# Patient Record
Sex: Female | Born: 1935 | Race: White | Hispanic: No | Smoking: Never smoker
Health system: Southern US, Community
[De-identification: ages and names within clinical notes are randomized; demographics above are authoritative.]

## PROBLEM LIST (undated history)

## (undated) DIAGNOSIS — I2510 Atherosclerotic heart disease of native coronary artery without angina pectoris: Secondary | ICD-10-CM

## (undated) DIAGNOSIS — I509 Heart failure, unspecified: Secondary | ICD-10-CM

## (undated) DIAGNOSIS — I10 Essential (primary) hypertension: Secondary | ICD-10-CM

## (undated) HISTORY — PX: OTHER SURGICAL HISTORY: SHX169

## (undated) HISTORY — PX: TOTAL HIP ARTHROPLASTY: SHX124

## (undated) HISTORY — PX: CORONARY ARTERY BYPASS GRAFT: SHX141

## (undated) HISTORY — PX: ABDOMINAL HYSTERECTOMY: SHX81

## (undated) HISTORY — PX: CHOLECYSTECTOMY: SHX55

---

## 2008-02-15 ENCOUNTER — Inpatient Hospital Stay: Payer: Self-pay | Admitting: Internal Medicine

## 2008-02-19 ENCOUNTER — Encounter: Payer: Self-pay | Admitting: Internal Medicine

## 2008-02-25 ENCOUNTER — Encounter: Payer: Self-pay | Admitting: Internal Medicine

## 2008-04-06 ENCOUNTER — Emergency Department: Payer: Self-pay | Admitting: Emergency Medicine

## 2008-04-29 ENCOUNTER — Encounter: Payer: Self-pay | Admitting: Internal Medicine

## 2008-05-25 ENCOUNTER — Encounter: Payer: Self-pay | Admitting: Internal Medicine

## 2008-06-25 ENCOUNTER — Encounter: Payer: Self-pay | Admitting: Internal Medicine

## 2008-07-02 ENCOUNTER — Ambulatory Visit: Payer: Self-pay | Admitting: Family

## 2008-07-08 ENCOUNTER — Ambulatory Visit: Payer: Self-pay | Admitting: Internal Medicine

## 2008-09-01 ENCOUNTER — Ambulatory Visit: Payer: Self-pay | Admitting: Specialist

## 2008-09-15 ENCOUNTER — Ambulatory Visit: Payer: Self-pay | Admitting: Internal Medicine

## 2008-10-20 ENCOUNTER — Ambulatory Visit: Payer: Self-pay | Admitting: Specialist

## 2010-08-12 IMAGING — CR DG SHOULDER 3+V*L*
1 series · 3 of 3 positions shown · non-contrast
Comparison: none

REASON FOR EXAM: pain
COMMENTS:

PROCEDURE:     DXR - DXR SHOULDER LEFT COMPLETE  - July 02, 2008 [DATE]
RESULT:     No fracture, dislocation or other acute bony abnormality is
identified. If rotator cuff injury is a clinical consideration, the shoulder
could be further evaluated by MRI.

[Series 1: view not recorded · 0.17mm/px · 3 of 3 slices shown]
[im 1/3]
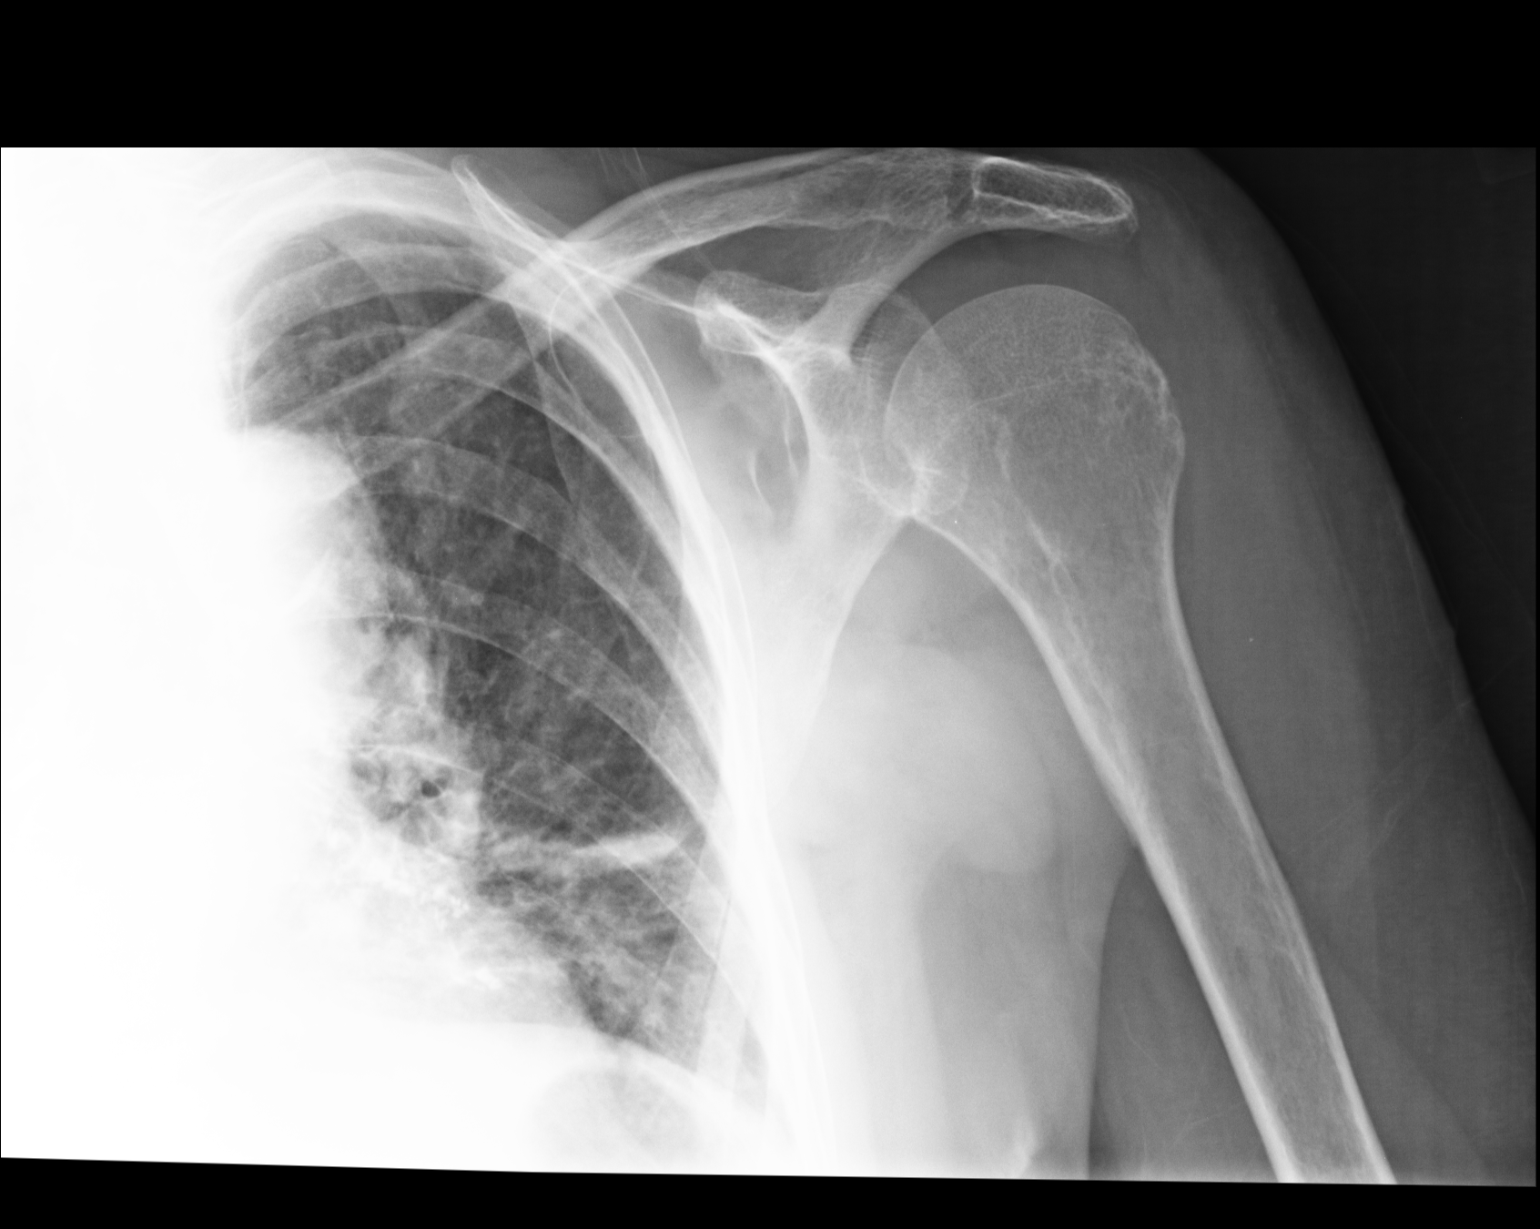
[im 2/3]
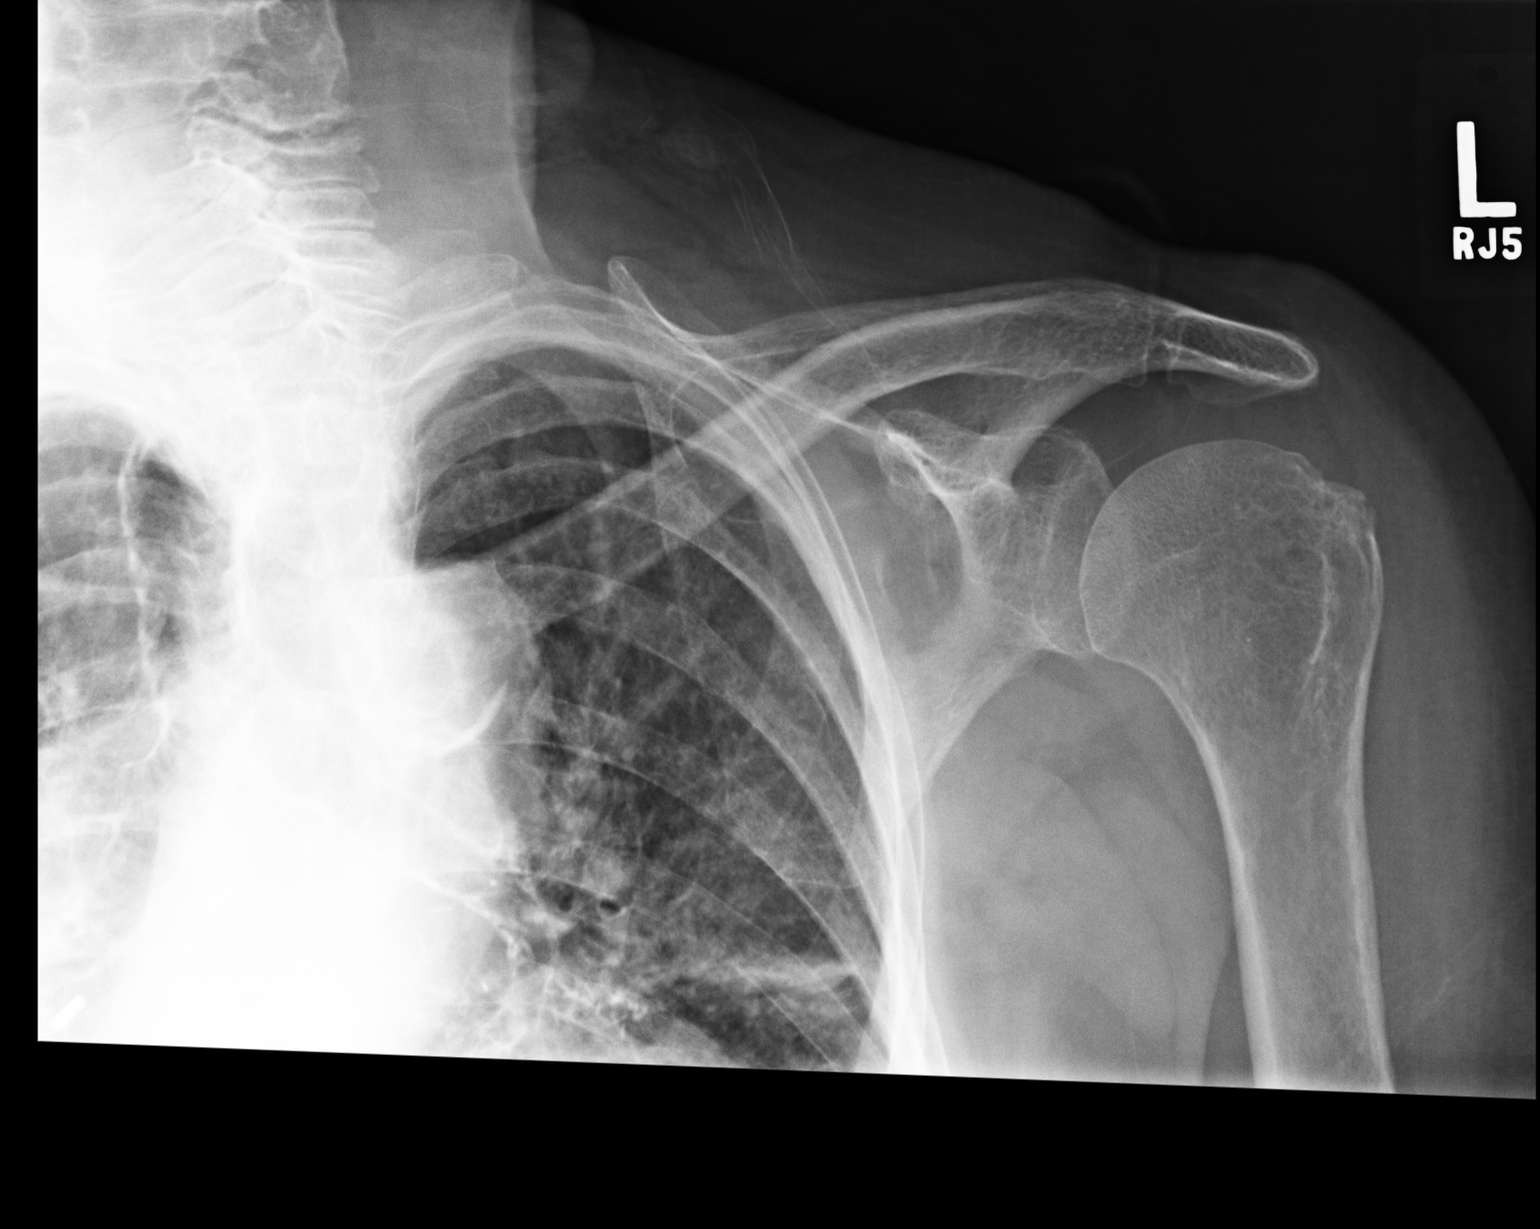
[im 3/3]
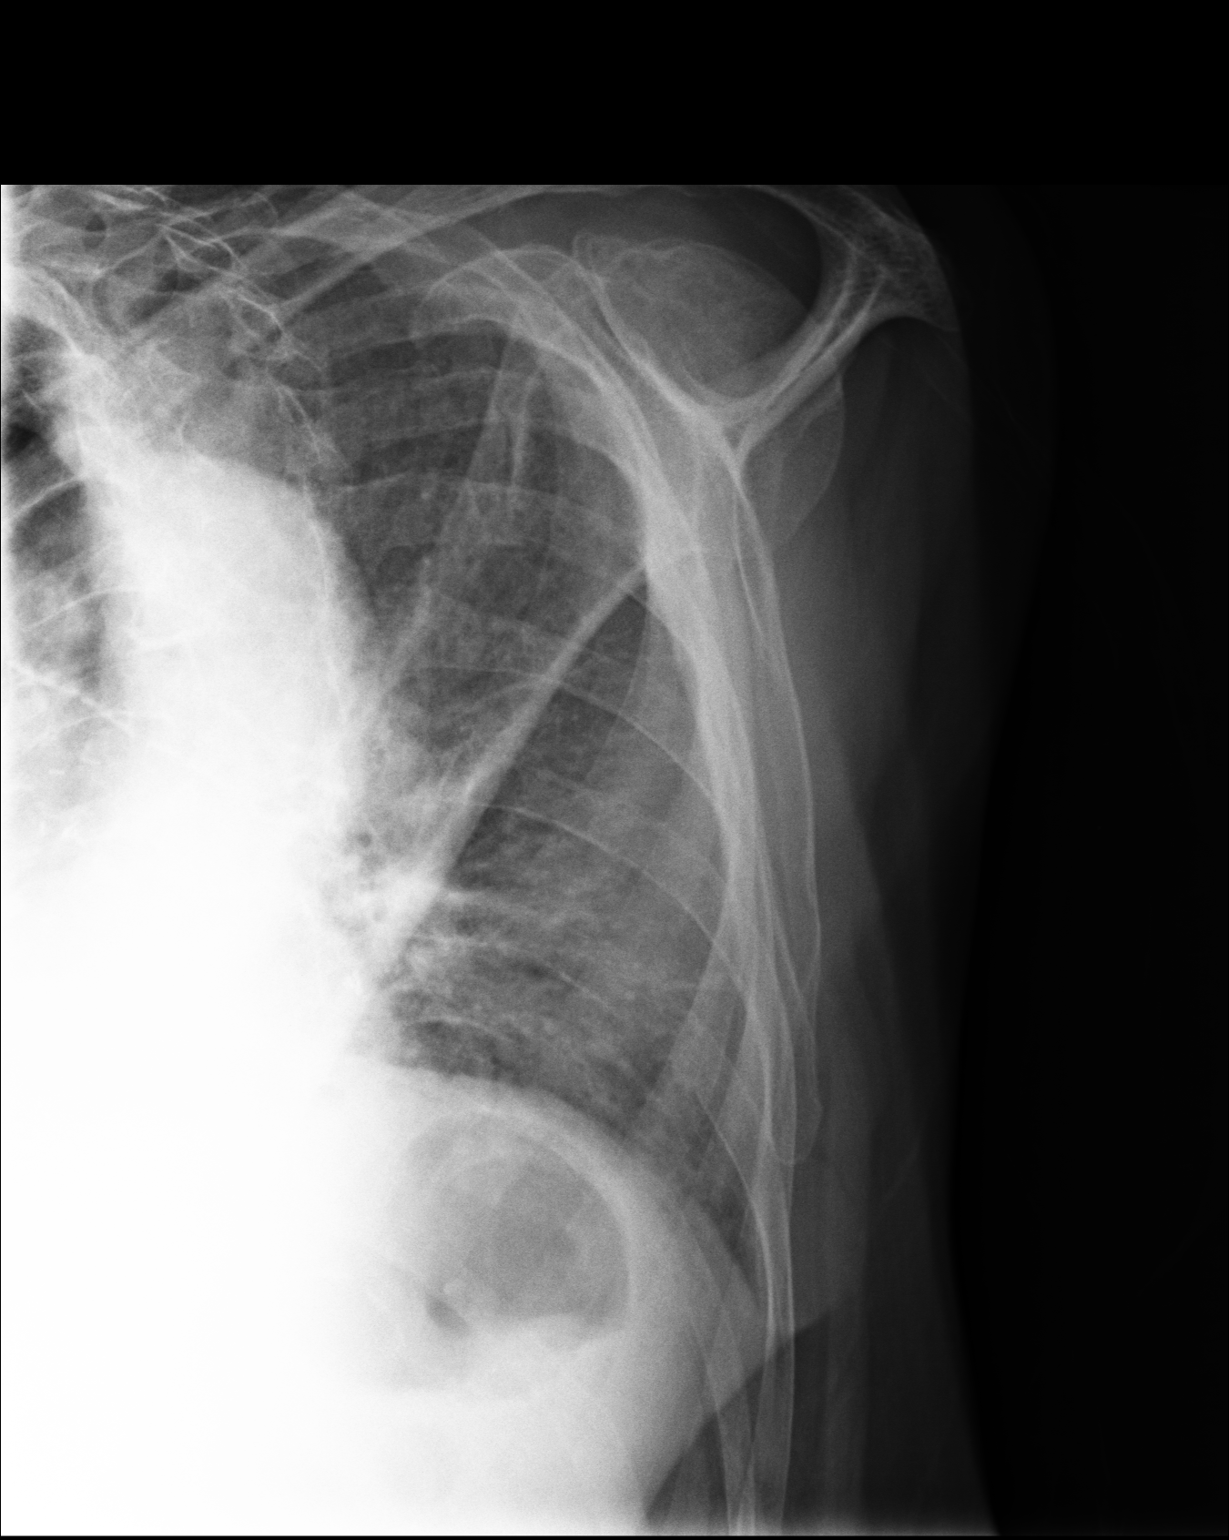

[3 of 3 positions shown; findings below may reference images not displayed]

IMPRESSION: 1.     No acute changes are identified.

## 2010-08-12 IMAGING — CR CERVICAL SPINE - COMPLETE 4+ VIEW
1 series · 7 of 7 positions shown · non-contrast
Comparison: none

REASON FOR EXAM: pain
COMMENTS:

[Series 1: view not recorded · 0.17mm/px · 7 of 7 slices shown]
[im 1/7]
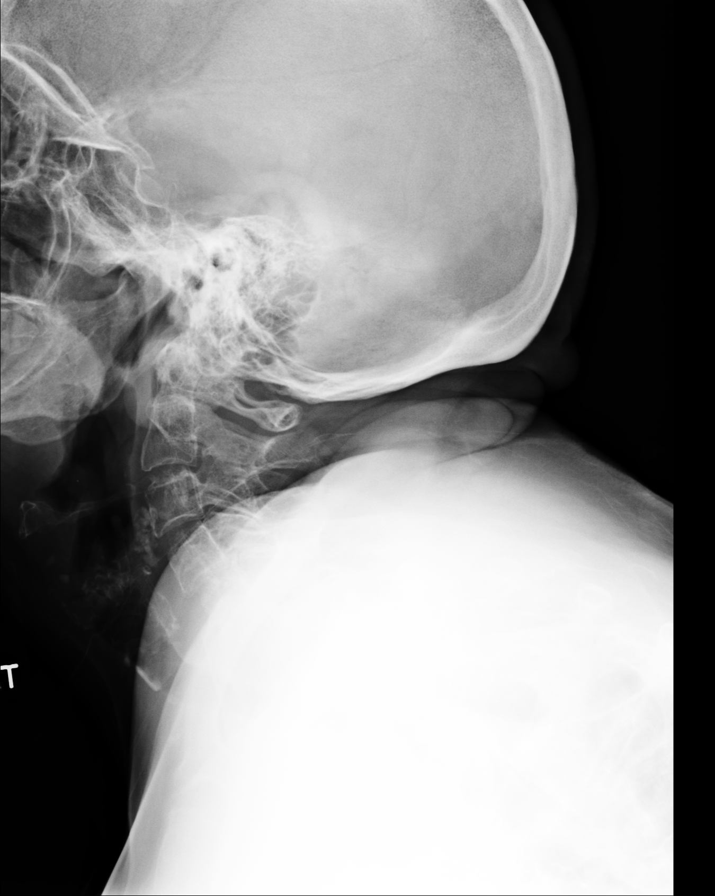
[im 2/7]
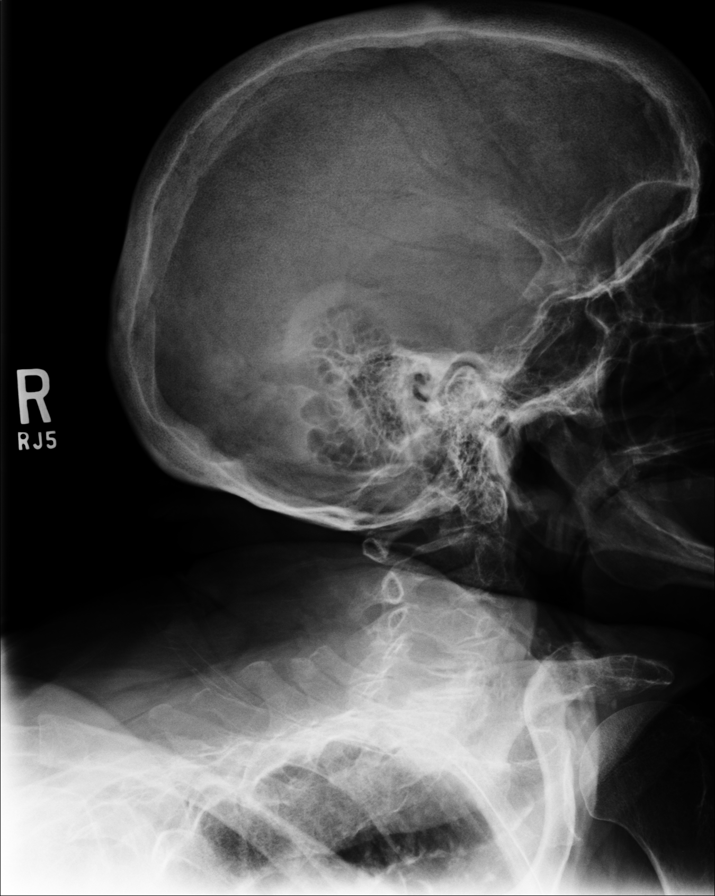
[im 3/7]
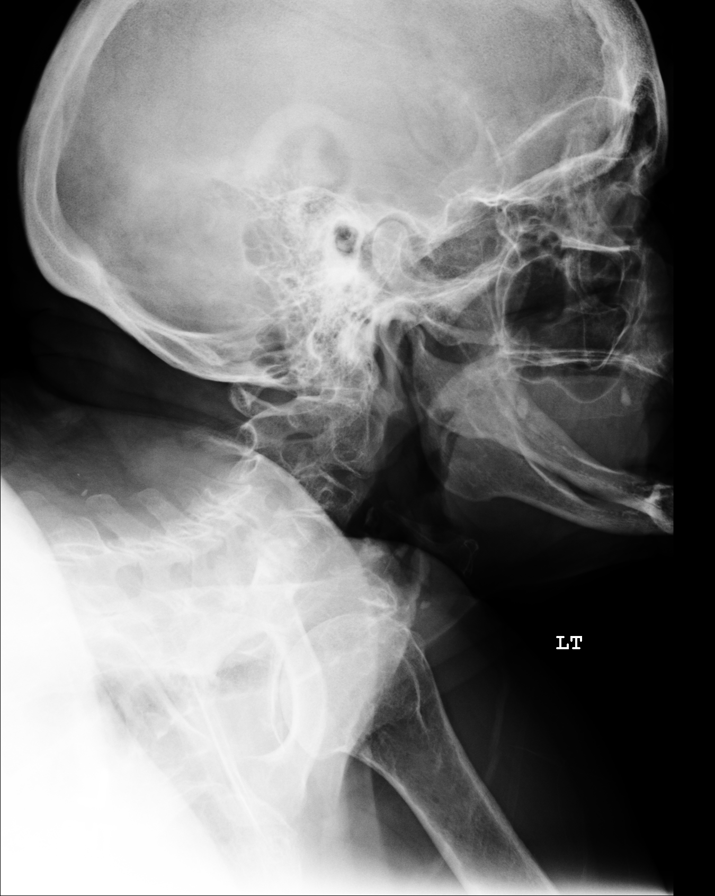
[im 4/7]
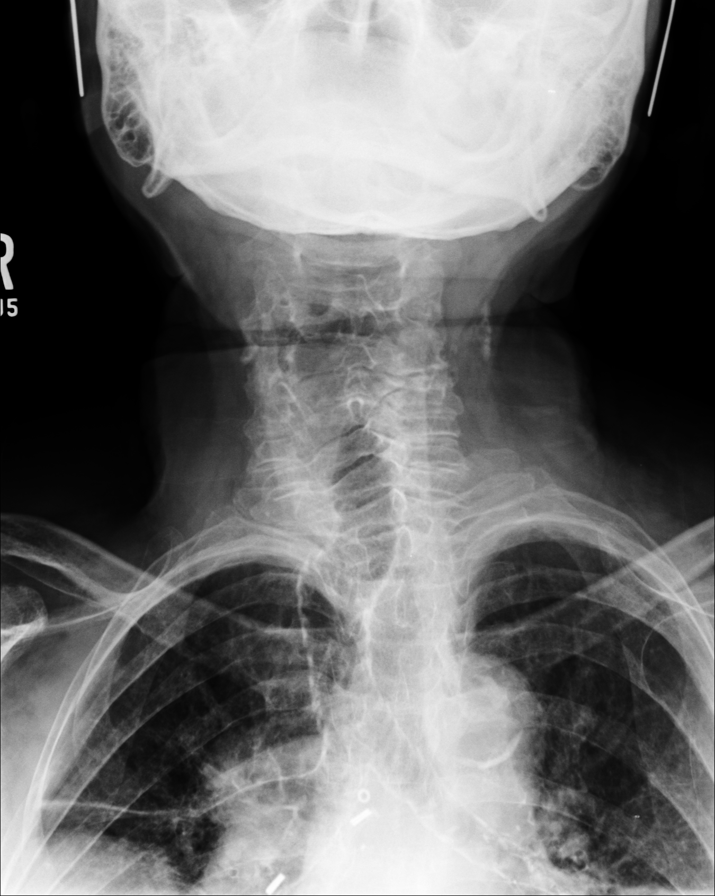
[im 5/7]
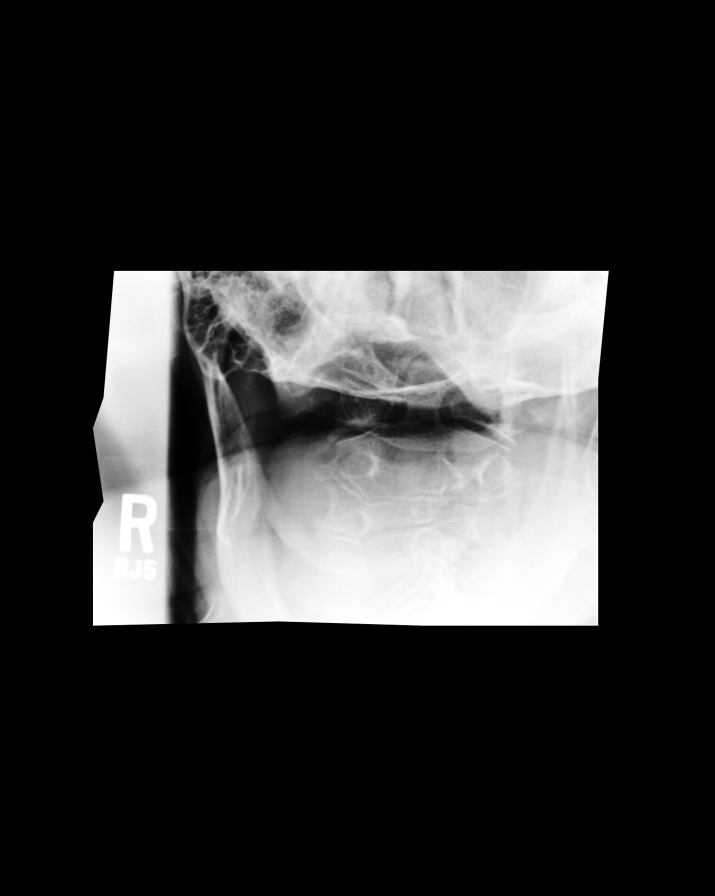
[im 6/7]
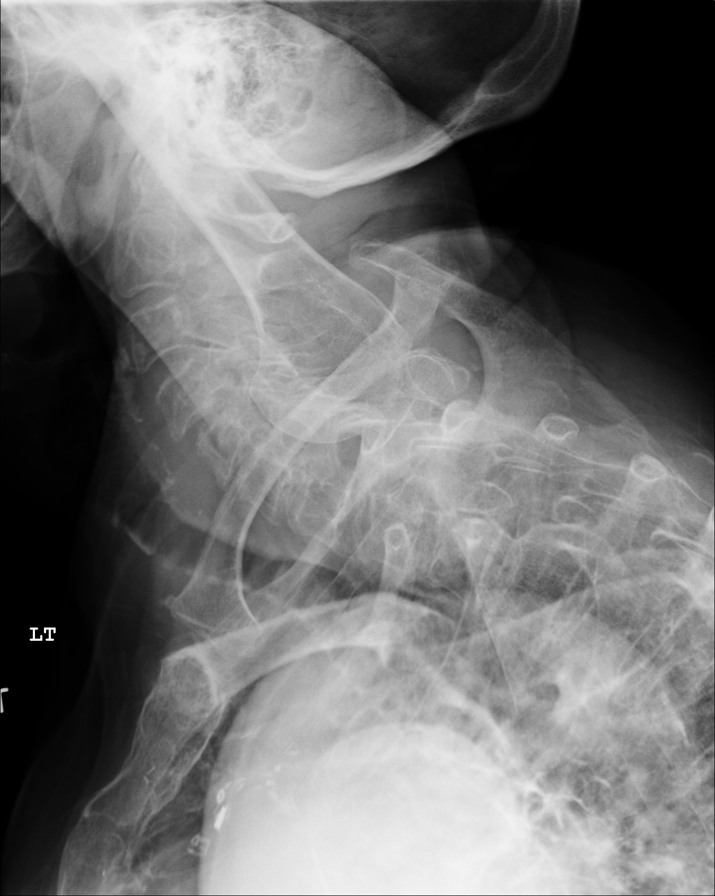
[im 7/7]
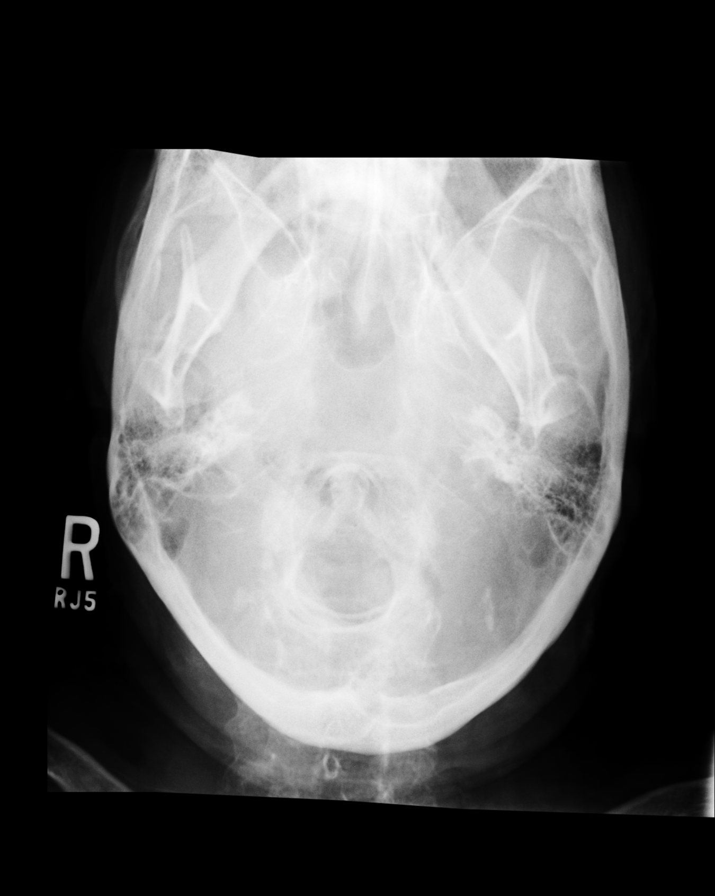

[7 of 7 positions shown; findings below may reference images not displayed]

PROCEDURE:     DXR - DXR CERVICAL SPINE COMPLETE  - July 02, 2008 [DATE]

RESULT:     The vertebral body heights and the intervertebral disc spaces
are well maintained. The vertebral body alignment is normal. There is an
exaggerated lordotic curvature of the cervical spine, possibly compensating
for a lordotic curvature of the thoracic spine. Oblique views shows spur
impingement on the neural foramina at C5-C6 on the right. No definite spur
impingement is seen on the left. The odontoid process is intact. In the AP
view there is noted a slight cervicothoracic scoliosis.
IMPRESSION: 1. No fracture is seen.
2. No definite disc space narrowing is identified.
3. There is an exaggerated lordotic curvature of the cervical spine.
4. There is apparent spur impingement on the neural foramina at C5-C6 and
although not mentioned above there is possible spur impingement on the
neural foramina at C4-C5 on the right also.

## 2010-10-26 IMAGING — CR DG CHEST 2V
1 series · 2 of 2 positions shown · non-contrast
Comparison: none

REASON FOR EXAM: sob
COMMENTS:

PROCEDURE:     DXR - DXR CHEST PA (OR AP) AND LATERAL  - September 15, 2008 [DATE]
RESULT:     Comparison: None

[Series 1: view not recorded · 0.17mm/px · 2 of 2 slices shown]
[im 1/2]
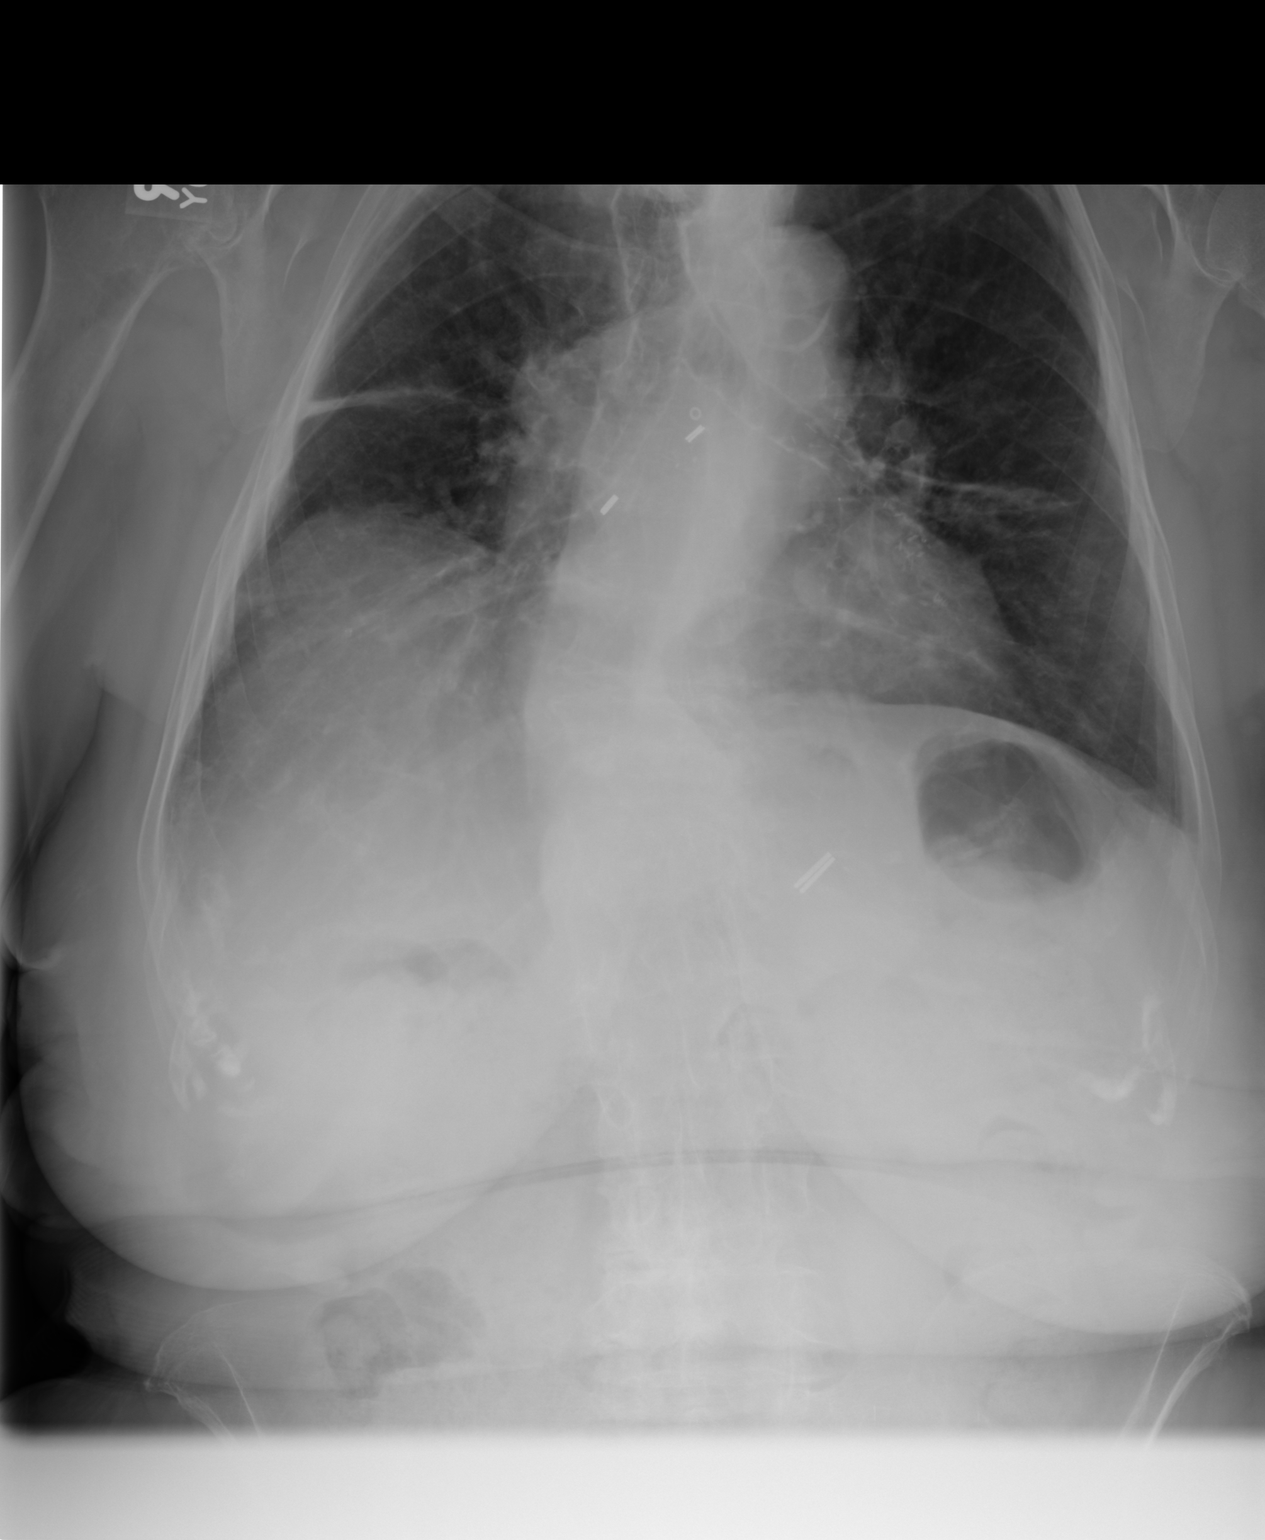
[im 2/2]
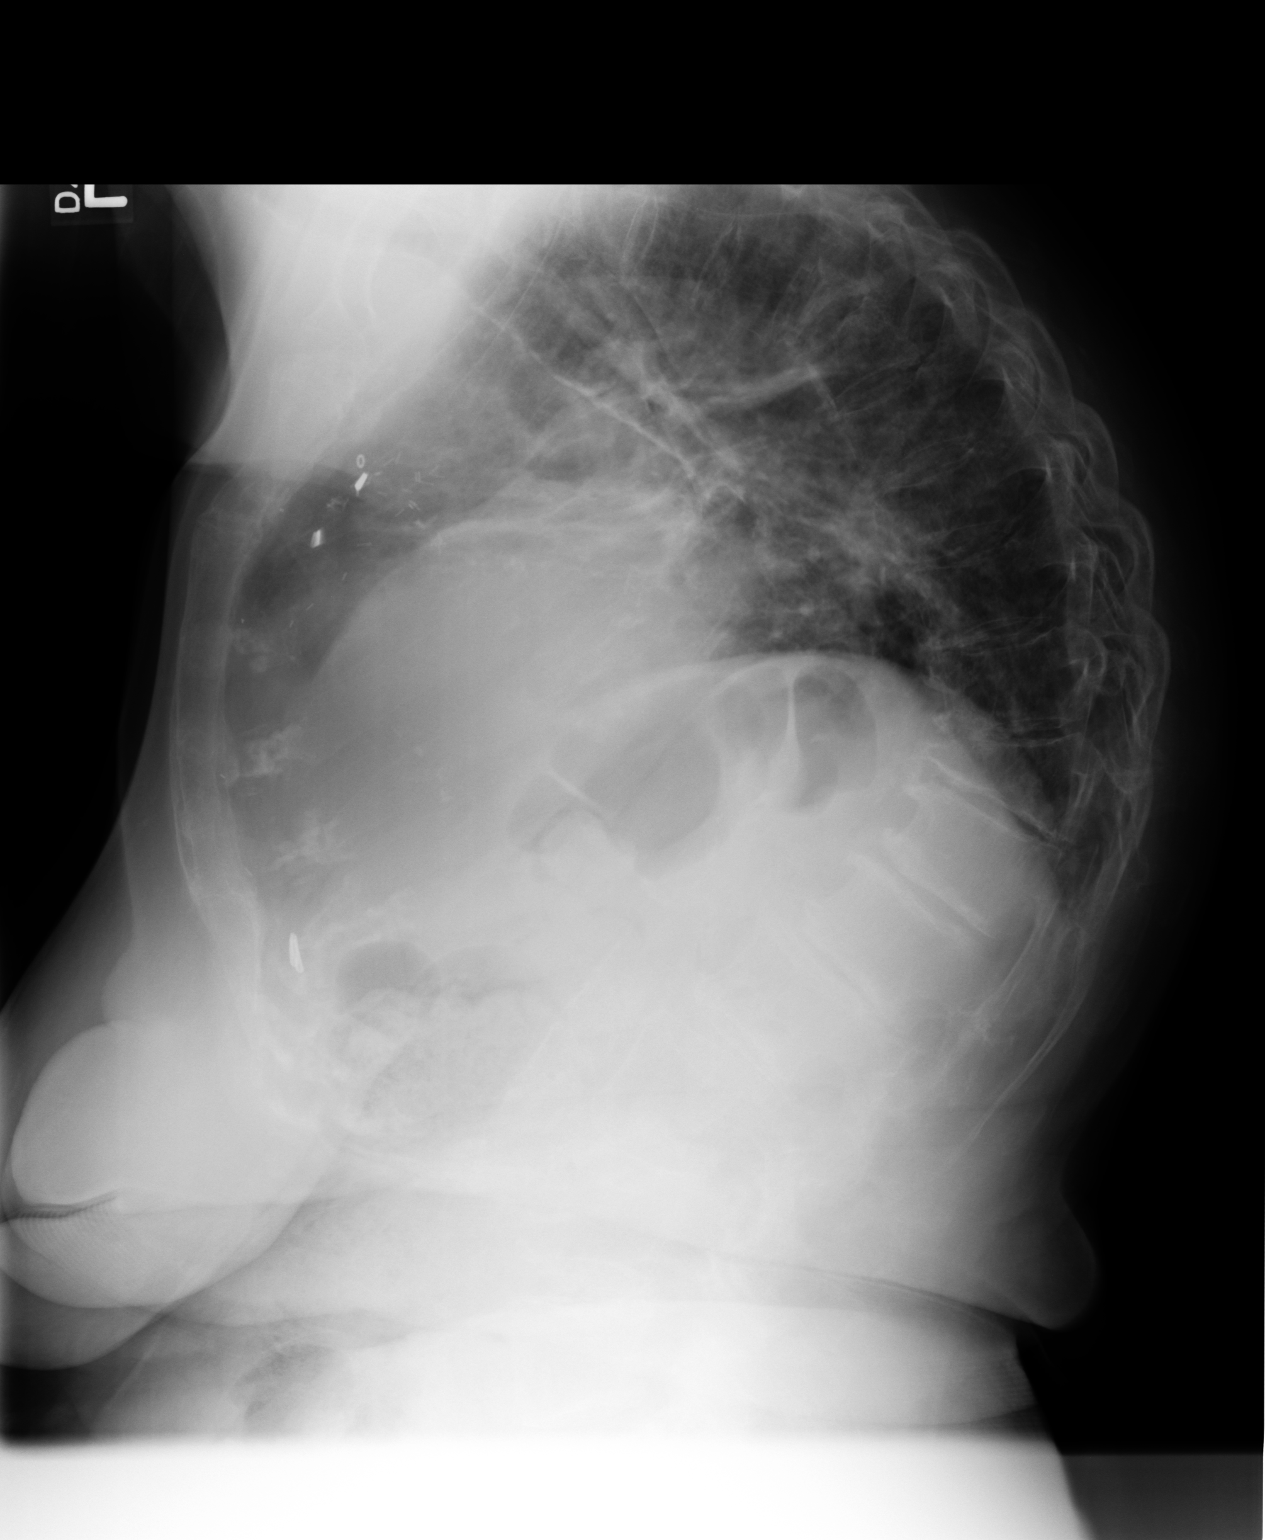

[2 of 2 positions shown; findings below may reference images not displayed]

FINDINGS: PA and lateral chest radiographs are provided. There are low lung volumes.
There is elevation of the right hemidiaphragm. There is thickening of the
minor fissure likely resenting fibrosis. There is no focal parenchymal
opacity, pleural effusion, or pneumothorax. The heart and mediastinum are
unremarkable. There is a vertebral body compression fracture of the lower
thoracic spine with kyphosis centered around this vertebral body compression
fracture.
IMPRESSION: No acute disease of the chest.

Vertebral body compression fracture of the lower thoracic spine which is of
indeterminate age.

## 2015-09-04 ENCOUNTER — Emergency Department: Payer: Medicare Other

## 2015-09-04 ENCOUNTER — Encounter: Payer: Self-pay | Admitting: Emergency Medicine

## 2015-09-04 ENCOUNTER — Observation Stay
Admission: EM | Admit: 2015-09-04 | Discharge: 2015-09-05 | Disposition: A | Discharge status: Home / Self Care | Payer: Medicare Other | Attending: Internal Medicine | Admitting: Internal Medicine

## 2015-09-04 DIAGNOSIS — R9389 Abnormal findings on diagnostic imaging of other specified body structures: Secondary | ICD-10-CM

## 2015-09-04 DIAGNOSIS — Z7982 Long term (current) use of aspirin: Secondary | ICD-10-CM | POA: Diagnosis not present

## 2015-09-04 DIAGNOSIS — R52 Pain, unspecified: Secondary | ICD-10-CM | POA: Diagnosis present

## 2015-09-04 DIAGNOSIS — I10 Essential (primary) hypertension: Secondary | ICD-10-CM

## 2015-09-04 DIAGNOSIS — S2242XA Multiple fractures of ribs, left side, initial encounter for closed fracture: Secondary | ICD-10-CM | POA: Diagnosis not present

## 2015-09-04 DIAGNOSIS — I110 Hypertensive heart disease with heart failure: Secondary | ICD-10-CM | POA: Insufficient documentation

## 2015-09-04 DIAGNOSIS — S2232XA Fracture of one rib, left side, initial encounter for closed fracture: Secondary | ICD-10-CM

## 2015-09-04 DIAGNOSIS — Z885 Allergy status to narcotic agent status: Secondary | ICD-10-CM | POA: Diagnosis not present

## 2015-09-04 DIAGNOSIS — E042 Nontoxic multinodular goiter: Secondary | ICD-10-CM | POA: Insufficient documentation

## 2015-09-04 DIAGNOSIS — Z96649 Presence of unspecified artificial hip joint: Secondary | ICD-10-CM | POA: Insufficient documentation

## 2015-09-04 DIAGNOSIS — Z90710 Acquired absence of both cervix and uterus: Secondary | ICD-10-CM | POA: Diagnosis not present

## 2015-09-04 DIAGNOSIS — S2239XA Fracture of one rib, unspecified side, initial encounter for closed fracture: Secondary | ICD-10-CM | POA: Diagnosis present

## 2015-09-04 DIAGNOSIS — Z9049 Acquired absence of other specified parts of digestive tract: Secondary | ICD-10-CM | POA: Insufficient documentation

## 2015-09-04 DIAGNOSIS — Z79899 Other long term (current) drug therapy: Secondary | ICD-10-CM | POA: Insufficient documentation

## 2015-09-04 DIAGNOSIS — Z951 Presence of aortocoronary bypass graft: Secondary | ICD-10-CM | POA: Diagnosis not present

## 2015-09-04 DIAGNOSIS — W19XXXA Unspecified fall, initial encounter: Secondary | ICD-10-CM | POA: Diagnosis not present

## 2015-09-04 DIAGNOSIS — I2510 Atherosclerotic heart disease of native coronary artery without angina pectoris: Secondary | ICD-10-CM

## 2015-09-04 HISTORY — DX: Atherosclerotic heart disease of native coronary artery without angina pectoris: I25.10

## 2015-09-04 HISTORY — DX: Heart failure, unspecified: I50.9

## 2015-09-04 HISTORY — DX: Essential (primary) hypertension: I10

## 2015-09-04 LAB — URINALYSIS COMPLETE WITH MICROSCOPIC (ARMC ONLY)
BACTERIA UA: NONE SEEN
Bilirubin Urine: NEGATIVE
Glucose, UA: NEGATIVE mg/dL
HGB URINE DIPSTICK: NEGATIVE
Ketones, ur: NEGATIVE mg/dL
LEUKOCYTES UA: NEGATIVE
Nitrite: NEGATIVE
PH: 5 (ref 5.0–8.0)
PROTEIN: NEGATIVE mg/dL
SPECIFIC GRAVITY, URINE: 1.009 (ref 1.005–1.030)

## 2015-09-04 LAB — PROTIME-INR
INR: 1.08
Prothrombin Time: 14.2 seconds (ref 11.4–15.0)

## 2015-09-04 LAB — BASIC METABOLIC PANEL
ANION GAP: 9 (ref 5–15)
BUN: 27 mg/dL — ABNORMAL HIGH (ref 6–20)
CALCIUM: 9.4 mg/dL (ref 8.9–10.3)
CO2: 31 mmol/L (ref 22–32)
Chloride: 101 mmol/L (ref 101–111)
Creatinine, Ser: 1.2 mg/dL — ABNORMAL HIGH (ref 0.44–1.00)
GFR, EST AFRICAN AMERICAN: 45 mL/min — AB (ref >60)
GFR, EST NON AFRICAN AMERICAN: 39 mL/min — AB (ref >60)
Glucose, Bld: 105 mg/dL — ABNORMAL HIGH (ref 65–99)
Potassium: 4.4 mmol/L (ref 3.5–5.1)
Sodium: 141 mmol/L (ref 135–145)

## 2015-09-04 LAB — APTT: APTT: 31 s (ref 24–36)

## 2015-09-04 LAB — CBC
HEMATOCRIT: 34 % — AB (ref 35.0–47.0)
Hemoglobin: 11.4 g/dL — ABNORMAL LOW (ref 12.0–16.0)
MCH: 31.3 pg (ref 26.0–34.0)
MCHC: 33.4 g/dL (ref 32.0–36.0)
MCV: 93.6 fL (ref 80.0–100.0)
Platelets: 197 10*3/uL (ref 150–440)
RBC: 3.63 MIL/uL — AB (ref 3.80–5.20)
RDW: 13.7 % (ref 11.5–14.5)
WBC: 11.7 10*3/uL — AB (ref 3.6–11.0)

## 2015-09-04 MED ORDER — MORPHINE SULFATE (PF) 2 MG/ML IV SOLN: 0.5000 mg | INTRAVENOUS | Status: DC | PRN

## 2015-09-04 MED ORDER — FUROSEMIDE 40 MG PO TABS
40.0000 mg | ORAL_TABLET | ORAL | Status: DC
  Administered 2015-09-05: 40 mg via ORAL
  Filled 2015-09-04: qty 1

## 2015-09-04 MED ORDER — ONDANSETRON HCL 4 MG/2ML IJ SOLN: 4.0000 mg | Freq: Four times a day (QID) | INTRAMUSCULAR | Status: DC | PRN

## 2015-09-04 MED ORDER — LORATADINE 10 MG PO TABS
10.0000 mg | ORAL_TABLET | ORAL | Status: DC
  Administered 2015-09-05: 10 mg via ORAL
  Filled 2015-09-04: qty 1

## 2015-09-04 MED ORDER — ASPIRIN EC 81 MG PO TBEC
81.0000 mg | DELAYED_RELEASE_TABLET | Freq: Every day | ORAL | Status: DC
  Administered 2015-09-04: 81 mg via ORAL
  Filled 2015-09-04: qty 1

## 2015-09-04 MED ORDER — LIDOCAINE 5 % EX PTCH
1.0000 | MEDICATED_PATCH | CUTANEOUS | Status: DC
  Administered 2015-09-04: 1 via TRANSDERMAL
  Filled 2015-09-04 (×2): qty 1

## 2015-09-04 MED ORDER — BENZONATATE 100 MG PO CAPS: 100.0000 mg | ORAL_CAPSULE | Freq: Three times a day (TID) | ORAL | Status: DC | PRN

## 2015-09-04 MED ORDER — BISACODYL 5 MG PO TBEC: 5.0000 mg | DELAYED_RELEASE_TABLET | Freq: Every day | ORAL | Status: DC | PRN

## 2015-09-04 MED ORDER — METOPROLOL SUCCINATE ER 25 MG PO TB24
25.0000 mg | ORAL_TABLET | Freq: Every day | ORAL | Status: DC
  Administered 2015-09-05: 25 mg via ORAL
  Filled 2015-09-04: qty 1

## 2015-09-04 MED ORDER — ACETAMINOPHEN 325 MG PO TABS
650.0000 mg | ORAL_TABLET | Freq: Four times a day (QID) | ORAL | Status: DC | PRN
  Administered 2015-09-05: 650 mg via ORAL
  Filled 2015-09-04: qty 2

## 2015-09-04 MED ORDER — ACETAMINOPHEN 500 MG PO TABS
1000.0000 mg | ORAL_TABLET | ORAL | Status: AC
  Administered 2015-09-04: 1000 mg via ORAL
  Filled 2015-09-04: qty 2

## 2015-09-04 MED ORDER — PANTOPRAZOLE SODIUM 40 MG PO TBEC
40.0000 mg | DELAYED_RELEASE_TABLET | Freq: Every day | ORAL | Status: DC
  Administered 2015-09-05: 40 mg via ORAL
  Filled 2015-09-04: qty 1

## 2015-09-04 MED ORDER — ONDANSETRON HCL 4 MG PO TABS: 4.0000 mg | ORAL_TABLET | Freq: Four times a day (QID) | ORAL | Status: DC | PRN

## 2015-09-04 MED ORDER — CITALOPRAM HYDROBROMIDE 20 MG PO TABS
20.0000 mg | ORAL_TABLET | Freq: Every day | ORAL | Status: DC
  Administered 2015-09-04: 20 mg via ORAL
  Filled 2015-09-04: qty 1

## 2015-09-04 MED ORDER — TRAMADOL HCL 50 MG PO TABS
25.0000 mg | ORAL_TABLET | Freq: Once | ORAL | Status: AC
  Administered 2015-09-04: 25 mg via ORAL

## 2015-09-04 MED ORDER — CELECOXIB 200 MG PO CAPS: 200.0000 mg | ORAL_CAPSULE | Freq: Every evening | ORAL | Status: DC | PRN

## 2015-09-04 MED ORDER — ACETAMINOPHEN 650 MG RE SUPP: 650.0000 mg | Freq: Four times a day (QID) | RECTAL | Status: DC | PRN

## 2015-09-04 MED ORDER — TRAMADOL HCL 50 MG PO TABS
50.0000 mg | ORAL_TABLET | ORAL | Status: DC
  Filled 2015-09-04: qty 1

## 2015-09-04 NOTE — H&P (Addendum)
PCP:   No primary care provider on file.   Chief Complaint:  Left rib pain  HPI: This is a 82 year old female who lives at home with her son and daughter. Yesterday at approximately 1 AM she had a mechanical fall. She uses a walker with wheels. She was able to get up therefore 911 was called and she was transported to the ER. She was seen aND dischargeD home. Today her pain on the left side is gotten worse, she was brought back to the ER.                                   Review of Systems:  The patient denies anorexia, fever, weight loss,, vision loss, decreased hearing, hoarseness, chest pain, syncope, dyspnea on exertion, peripheral edema, balance deficits, hemoptysis, abdominal pain, melena, hematochezia, severe indigestion/heartburn, hematuria, incontinence, genital sores, muscle weakness, suspicious skin lesions, transient blindness, difficulty walking, depression, unusual weight change, abnormal bleeding, enlarged lymph nodes, angioedema, and breast masses.  Past Medical History: Past Medical History  Diagnosis Date  . CHF (congestive heart failure) (HCC)   . Coronary artery disease   . Hypertension    Past Surgical History  Procedure Laterality Date  . Coronary artery bypass graft    . Cholecystectomy    . Abdominal hysterectomy    . Total knee replace N/A   . Total hip arthroplasty N/A     Medications: Prior to Admission medications   Medication Sig Start Date End Date Taking? Authorizing Provider  aspirin EC 81 MG tablet Take 81 mg by mouth at bedtime.   Yes Historical Provider, MD  benzonatate (TESSALON) 100 MG capsule Take 100 mg by mouth 3 (three) times daily as needed for cough.   Yes Historical Provider, MD  celecoxib (CELEBREX) 200 MG capsule Take 200 mg by mouth at bedtime as needed for mild pain or moderate pain.   Yes Historical Provider, MD  citalopram (CELEXA) 20 MG tablet Take 20 mg by mouth at bedtime.   Yes Historical Provider, MD  furosemide (LASIX) 40 MG  tablet Take 40 mg by mouth every morning.   Yes Historical Provider, MD  Homeopathic Products ([employer] LEG CRAMPS PAIN RELIEF) TABS Take 1 tablet by mouth 2 (two) times daily.   Yes Historical Provider, MD  loratadine (CLARITIN) 10 MG tablet Take 10 mg by mouth every morning.   Yes Historical Provider, MD  metoprolol succinate (TOPROL-XL) 25 MG 24 hr tablet Take 25 mg by mouth daily.   Yes Historical Provider, MD  Multiple Vitamins-Minerals (COMPLETE MULTIVITAMIN/MINERAL PO) Take 1 tablet by mouth every morning.   Yes Historical Provider, MD  Multiple Vitamins-Minerals (HAIR SKIN AND NAILS FORMULA) TABS Take 1 tablet by mouth 2 (two) times daily.   Yes Historical Provider, MD  Multiple Vitamins-Minerals (OCUVITE ADULT 50+) CAPS Take 1 capsule by mouth every morning.   Yes Historical Provider, MD  omeprazole (PRILOSEC) 40 MG capsule Take 40 mg by mouth every morning.   Yes Historical Provider, MD  simvastatin (ZOCOR) 20 MG tablet Take 20 mg by mouth every evening.   Yes Historical Provider, MD  traMADol (ULTRAM) 50 MG tablet Take 25 mg by mouth every 4 (four) hours.   Yes Historical Provider, MD  Ubiquinone (ULTRA COQ10 PO) Take 100 mg by mouth every morning.   Yes Historical Provider, MD  zolpidem (AMBIEN) 5 MG tablet Take 0.5 mg by mouth at bedtime.  Yes Historical Provider, MD    Allergies:   Allergies  Allergen Reactions  . Percocet [Oxycodone-Acetaminophen] Other (See Comments)    hallucinations    Social History:  reports that she has never smoked. She does not have any smokeless tobacco history on file. She reports that she does not drink alcohol. Her drug history is not on file.  Family History: History reviewed. No pertinent family history.  Physical Exam: Filed Vitals:   09/04/15 1700 09/04/15 1730 09/04/15 1830 09/04/15 1930  BP: 147/81 163/80  155/83  Pulse: 72 73 59 81  Temp:      TempSrc:      Resp:  22 20 20   Height:      Weight:      SpO2: 99% 100% 100% 93%     General:  Alert and oriented times three, well developed and nourished, no acute distress Eyes: PERRLA, pink conjunctiva, no scleral icterus ENT: Moist oral mucosa, neck supple, no thyromegaly Lungs: clear to ascultation, no wheeze, no crackles, no use of accessory muscles Cardiovascular: regular rate and rhythm, no regurgitation, no gallops, murmurs. No carotid bruits, no JVD Abdomen: soft, positive BS, non-tender, non-distended, no organomegaly, not an acute abdomen GU: not examined Neuro: Appears grossly intact Musculoskeletal: strength not assessed due to patient's rib pain, no clubbing, cyanosis 1+ B/L LE pitting edema Skin: no rash, no subcutaneous crepitation, no decubitus Psych: appropriate patient   Labs on Admission:   Recent Labs  09/04/15 2012  NA 141  K 4.4  CL 101  CO2 31  GLUCOSE 105*  BUN 27*  CREATININE 1.20*  CALCIUM 9.4   No results for input(s): AST, ALT, ALKPHOS, BILITOT, PROT, ALBUMIN in the last 72 hours. No results for input(s): LIPASE, AMYLASE in the last 72 hours.  Recent Labs  09/04/15 2012  WBC 11.7*  HGB 11.4*  HCT 34.0*  MCV 93.6  PLT 197   No results for input(s): CKTOTAL, CKMB, CKMBINDEX, TROPONINI in the last 72 hours. Invalid input(s): POCBNP No results for input(s): DDIMER in the last 72 hours. No results for input(s): HGBA1C in the last 72 hours. No results for input(s): CHOL, HDL, LDLCALC, TRIG, CHOLHDL, LDLDIRECT in the last 72 hours. No results for input(s): TSH, T4TOTAL, T3FREE, THYROIDAB in the last 72 hours.  Invalid input(s): FREET3 No results for input(s): VITAMINB12, FOLATE, FERRITIN, TIBC, IRON, RETICCTPCT in the last 72 hours.  Micro Results: No results found for this or any previous visit (from the past 240 hour(s)).   Radiological Exams on Admission: Ct Chest Wo Contrast  09/04/2015  CLINICAL DATA:  Left rib pain after falling at 2 a.m. today. EXAM: CT CHEST WITHOUT CONTRAST TECHNIQUE: Multidetector CT  imaging of the chest was performed following the standard protocol without IV contrast. COMPARISON:  Chest radiographs dated 09/15/2008. FINDINGS: Mediastinum/Lymph Nodes: Dense atheromatous Coronary artery calcifications. No mediastinal blood or enlarged lymph nodes. Mildly enlarged thyroid gland containing multiple small nodules with coarse calcifications. Lungs/Pleura: A large right diaphragmatic eventration is again demonstrated. Bilateral linear atelectasis a or scarring. No pneumothorax. Small left pleural effusion. Mild left lower lobe atelectasis. Upper abdomen: No acute findings. Musculoskeletal: Mildly displaced left eighth, ninth, tenth and eleventh posterior rib fractures. Additional old, healed bilateral rib fractures. Interval approximately 9% T9 vertebral body compression fracture. This has an appearance suggesting an acute fracture in the axial plane. However, in the coronal and sagittal planes, this appears more chronic. There is associated mild to moderate bony retropulsion, fused with the remainder  the vertebral body. This causing mild to moderate canal stenosis. There are additional T4, T5 and T11 vertebral compression deformities with no visible acute fracture lines. Mild bony retropulsion at the T4-T5 levels. IMPRESSION: 1. Acute left eighth, ninth, tenth and eleventh posterior rib fractures with mild displacement. 2. Small left pleural effusion. 3. Mild left lower lobe atelectasis. 4. Additional bilateral linear atelectasis and scarring. 5. Stable large right diaphragmatic eventration. 6. Multiple old thoracic vertebral compression deformities with progression. No definite acute vertebral fractures are seen. 7. Dense atheromatous coronary artery calcifications. 8. Multinodular thyroid. Electronically Signed   By: Beckie Salts M.D.   On: 09/04/2015 18:52    Assessment/Plan Present on Admission:  . Inadequate pain control . mechanical Fall . Closed rib fracture -Bring in for 23 hour  observation -Continue Tylenol and Ultram. low-dose morphine 0.5 mg when necessary  Heart artery disease -Stable, home medications resumed.  Hypertension -Stable, home medications resumed  Multinodular goiter -TSH, thyroid ultrasound  Jamicheal Heard 09/04/2015, 9:18 PM

## 2015-09-04 NOTE — Care Management Obs Status (Signed)
MEDICARE OBSERVATION STATUS NOTIFICATION   Patient Details  Name: Danielle Mathews MRN: [id number] Date of Birth: 08-Jan-1923   Medicare Observation Status Notification Given:  Yes    CrutchfieldDerrill Memo, Barry Faircloth M, RN 09/04/2015, 9:22 PM

## 2015-09-04 NOTE — ED Provider Notes (Signed)
[facility] Emergency Department Provider Note  ____________________________________________  Time seen: Approximately 5:22 PM  I have reviewed the triage vital signs and the nursing notes.   HISTORY  Chief Complaint Fall    HPI Danielle Mathews is a 82 y.o. female who presents for evaluation after falling last night. She is from [place], and staying with family for the last few days. She got up and uses a walker, but tripped and fell onto her left side. She did not strike her head injure her neck legs arms hips or stomach, but complains of pain right at the back of the left lower rib cage, pointing approximately at the mid axillary line along the ninth and 10th left ribs.  She denies shortness of breath, fevers chills or recent illness. She does fall occasionally. Her family including her daughter are with her and report that she is in her normal state of health otherwise. She does take tramadol, but they're concerned she could've broken a rib. She has otherwise been doing okay, able to walk today with a walker but reports fairly severe pain whenever she goes to twist or move along the left side of the rib cage.   No past medical history on file. Congestive heart failure, with fluid on both lungs chronically as well as lower leg swelling chronically, unchanged per family and patient. Elevated blood pressure Coronary bypass grafting    There are no active problems to display for this patient.   No past surgical history on file.  No current outpatient prescriptions on file.  Allergies Review of patient's allergies indicates not on file.  History reviewed. No pertinent family history. Family do not have medication list, they report the patient takes "fluid pills" as well as tramadol for pain and a blood pressure medicine. Social History Social History  Substance Use Topics  . Smoking status: Never Smoker   . Smokeless tobacco: None  . Alcohol Use:  No    Review of Systems Constitutional: No fever/chills Eyes: No visual changes. ENT: No sore throat. Cardiovascular: Denies chest painExcept "ribs" along the left side of the chest since falling. Respiratory: Denies shortness of breath. Gastrointestinal: No abdominal pain.  No nausea, no vomiting.  No diarrhea.  No constipation. Genitourinary: Negative for dysuria. Musculoskeletal: Negative for back pain. She has chronic pain and arthritic discomfort in her knees. This is unchanged. She has been able to walk with her walker today as normally, but reporting pain along the left ribs especially when walking. Skin: Negative for rash. Neurological: Negative for headaches, focal weakness or numbness.  10-point ROS otherwise negative.  ____________________________________________   PHYSICAL EXAM:  VITAL SIGNS: ED Triage Vitals  Enc Vitals Group     BP 09/04/15 1624 157/63 mmHg     Pulse Rate 09/04/15 1624 64     Resp 09/04/15 1624 18     Temp 09/04/15 1624 98 F (36.7 C)     Temp Source 09/04/15 1624 Oral     SpO2 09/04/15 1624 99 %     Weight 09/04/15 1624 123 lb (55.792 kg)     Height 09/04/15 1624 4\' 10"  (1.473 m)     Head Cir --      Peak Flow --      Pain Score 09/04/15 1625 2     Pain Loc --      Pain Edu? --      Excl. in GC? --    Constitutional: Alert and oriented. Well appearing and in  no acute distress.Very pleasant, and escorted by amicable family. She does use 2 L of oxygen at home, which is her baseline per family due to chronic severe congestive heart failure. Eyes: Conjunctivae are normal. PERRL. EOMI. Head: Atraumatic. Nose: No congestion/rhinnorhea. Mouth/Throat: Mucous membranes are moist.  Oropharynx non-erythematous. Neck: No stridor.  No cervical spine tenderness. Fairly advanced kyphosis. Cardiovascular: Normal rate, regular rhythm. Grossly normal heart sounds aside from a mild systolic ejection murmur, which family and patient report are unknown  condition.  Good peripheral circulation.Focal tenderness over the left lateral ribs. Respiratory: Normal respiratory effort.  No retractions. Lungs CTAB. The patient has focal point tenderness along the approximately 10th left lateral rib cage on the mid axillary line without bruising or edema. Gastrointestinal: Soft and nontender. No distention.  Musculoskeletal:   1+ pitting edema in the lower extremities bilateral, this is reported as chronic and unchanged.  RIGHT Right upper extremity demonstrates normal strength, good use of all muscles. No edema bruising or contusions of the right shoulder/upper arm, right elbow, right forearm / hand. Full range of motion of the right right upper extremity without pain. No evidence of trauma. Strong radial pulse. Intact median/ulnar/radial neuro-muscular exam.  LEFT Left upper extremity demonstrates normal strength, good use of all muscles. No edema bruising or contusions of the left shoulder/upper arm, left elbow, left forearm / hand. Full range of motion of the left  upper extremity without pain. No evidence of trauma. Strong radial pulse. Intact median/ulnar/radial neuro-muscular exam.  Lower Extremities  No edema. Normal DP/PT pulses bilateral with good cap refill.  Normal neuro-motor function lower extremities bilateral.  RIGHT Right lower extremity demonstrates normal strength, good use of all muscles. No edema bruising or contusions of the right hip, right knee, right ankle. Full range of motion of the right lower extremity without pain except for chronic difficulty with knee flexion and extension. No pain on axial loading. No evidence of trauma.  LEFT Left lower extremity demonstrates normal strength, good use of all muscles. No edema bruising or contusions of the hip,  knee, ankle. Full range of motion of the left lower extremity without pain except for some limitation in knee flexion and extension which is reported as chronic. No pain on axial  loading. No evidence of trauma.  Neurologic:  Normal speech and language. No gross focal neurologic deficits are appreciated. No gait instability. Skin:  Skin is warm, dry and intact. No rash noted. Psychiatric: Mood and affect are normal. Speech and behavior are normal.  ____________________________________________   LABS (all labs ordered are listed, but only abnormal results are displayed)  Labs Reviewed - No data to display ____________________________________________  EKG  Reviewed and interpreted me at 1730 Heart rate 75 mg 116 QTc 4:30 Atrial fibrillation with a associated left bundle branch block. ____________________________________________  RADIOLOGY  CT Chest Wo Contrast (Final result) Result time: 09/04/15 18:52:29   Final result by Rad Results In Interface (09/04/15 18:52:29)   Narrative:   CLINICAL DATA: Left rib pain after falling at 2 a.m. today.  EXAM: CT CHEST WITHOUT CONTRAST  TECHNIQUE: Multidetector CT imaging of the chest was performed following the standard protocol without IV contrast.  COMPARISON: Chest radiographs dated 09/15/2008.  FINDINGS: Mediastinum/Lymph Nodes: Dense atheromatous Coronary artery calcifications. No mediastinal blood or enlarged lymph nodes. Mildly enlarged thyroid gland containing multiple small nodules with coarse calcifications.  Lungs/Pleura: A large right diaphragmatic eventration is again demonstrated. Bilateral linear atelectasis a or scarring. No pneumothorax. Small left pleural effusion.  Mild left lower lobe atelectasis.  Upper abdomen: No acute findings.  Musculoskeletal: Mildly displaced left eighth, ninth, tenth and eleventh posterior rib fractures. Additional old, healed bilateral rib fractures.  Interval approximately 9% T9 vertebral body compression fracture. This has an appearance suggesting an acute fracture in the axial plane. However, in the coronal and sagittal planes, this appears more  chronic. There is associated mild to moderate bony retropulsion, fused with the remainder the vertebral body. This causing mild to moderate canal stenosis. There are additional T4, T5 and T11 vertebral compression deformities with no visible acute fracture lines. Mild bony retropulsion at the T4-T5 levels.  IMPRESSION: 1. Acute left eighth, ninth, tenth and eleventh posterior rib fractures with mild displacement. 2. Small left pleural effusion. 3. Mild left lower lobe atelectasis. 4. Additional bilateral linear atelectasis and scarring. 5. Stable large right diaphragmatic eventration. 6. Multiple old thoracic vertebral compression deformities with progression. No definite acute vertebral fractures are seen. 7. Dense atheromatous coronary artery calcifications. 8. Multinodular thyroid.   Electronically Signed By: Beckie SaltsSteven Reid M.D. On: 09/04/2015 18:52       ____________________________________________   PROCEDURES  Procedure(s) performed: None  Critical Care performed: No  ____________________________________________   INITIAL IMPRESSION / ASSESSMENT AND PLAN / ED COURSE  Pertinent labs & imaging results that were available during my care of the patient were reviewed by me and considered in my medical decision making (see chart for details).  Patient presents after tripping last evening around 2 in the morning. She fell onto her left sinus had persistent pain over the left lower rib cage since. She otherwise appears well, in no acute distress with no acute complaints. She uses 2 L oxygen baseline, and is hemodynamically stable. Overall very reassuring physical examination, based on her complaint and age we will obtain CT of the chest to evaluate for rib fractures or other traumatic injury to the left lateral chest wall where her focal discomfort and pain is at. No indication for CT the head, no indication for cervical spine imaging as she is awake well oriented and did  not strike her head injured her neck and denies any abdominal complaint or lower extremity upper extremity complaint.  ----------------------------------------- 7:35 PM on 09/04/2015 -----------------------------------------  CT affirms left side rib fractures with an associated small effusion. The patient will be admitted for ongoing pain control, pulmonary toilet. Discussed with admitting, they will follow-up on labs.  ____________________________________________   FINAL CLINICAL IMPRESSION(S) / ED DIAGNOSES  Final diagnoses:  Rib fracture, left, closed, initial encounter      Sharyn CreamerMark Quale, MD 09/04/15 1935

## 2015-09-04 NOTE — ED Notes (Signed)
Pt. States she fell last night around 2 a.m.  EMS was dispatched last night.  Pt. Was evaluated and pt. Wanted to stay at home.  Pt. Woke today with pain to lt. Ribs.  No deformity or bruising noted at sight.  Pt. Family states pt. Was given 25 mg of tramadol at 11 am and 1 pm today.

## 2015-09-05 ENCOUNTER — Observation Stay: Payer: Medicare Other

## 2015-09-05 LAB — CBC
HEMATOCRIT: 28.6 % — AB (ref 35.0–47.0)
HEMOGLOBIN: 10 g/dL — AB (ref 12.0–16.0)
MCH: 32.2 pg (ref 26.0–34.0)
MCHC: 34.8 g/dL (ref 32.0–36.0)
MCV: 92.7 fL (ref 80.0–100.0)
Platelets: 164 10*3/uL (ref 150–440)
RBC: 3.09 MIL/uL — ABNORMAL LOW (ref 3.80–5.20)
RDW: 13.8 % (ref 11.5–14.5)
WBC: 8.6 10*3/uL (ref 3.6–11.0)

## 2015-09-05 LAB — BASIC METABOLIC PANEL
ANION GAP: 7 (ref 5–15)
BUN: 28 mg/dL — ABNORMAL HIGH (ref 6–20)
CHLORIDE: 104 mmol/L (ref 101–111)
CO2: 30 mmol/L (ref 22–32)
Calcium: 8.8 mg/dL — ABNORMAL LOW (ref 8.9–10.3)
Creatinine, Ser: 1.21 mg/dL — ABNORMAL HIGH (ref 0.44–1.00)
GFR calc Af Amer: 44 mL/min — ABNORMAL LOW (ref >60)
GFR, EST NON AFRICAN AMERICAN: 38 mL/min — AB (ref >60)
GLUCOSE: 100 mg/dL — AB (ref 65–99)
POTASSIUM: 3.7 mmol/L (ref 3.5–5.1)
Sodium: 141 mmol/L (ref 135–145)

## 2015-09-05 MED ORDER — TRAMADOL HCL 50 MG PO TABS: 25.0000 mg | ORAL_TABLET | Freq: Four times a day (QID) | ORAL | Status: AC | PRN

## 2015-09-05 MED ORDER — LIDOCAINE 5 % EX PTCH: 1.0000 | MEDICATED_PATCH | CUTANEOUS | Status: AC

## 2015-09-05 MED ORDER — TRAMADOL HCL 50 MG PO TABS: 25.0000 mg | ORAL_TABLET | Freq: Four times a day (QID) | ORAL | Status: DC | PRN

## 2015-09-05 MED ORDER — CETYLPYRIDINIUM CHLORIDE 0.05 % MT LIQD
7.0000 mL | Freq: Two times a day (BID) | OROMUCOSAL | Status: DC
  Administered 2015-09-05: 7 mL via OROMUCOSAL

## 2015-09-05 MED ORDER — BISACODYL 5 MG PO TBEC: 5.0000 mg | DELAYED_RELEASE_TABLET | Freq: Every day | ORAL | Status: AC | PRN

## 2015-09-05 MED ORDER — ACETAMINOPHEN 325 MG PO TABS: 650.0000 mg | ORAL_TABLET | Freq: Four times a day (QID) | ORAL | Status: AC | PRN

## 2015-09-05 NOTE — Progress Notes (Signed)
Patient was discharged home with home health in [place].  Daughter at bedside during discharge.  Patient was able to ambulate with PT in hallway with walker. Last pain med given was Tylenol at 1030, with good relief.  Scripts given to daughter and reviewed last dose of meds given.  Allowed time for questions. IV removed with cath intact. Patient was taken out to the visitors entrance by this RN via wheelchair and [place], CNA without incident.

## 2015-09-05 NOTE — Clinical Social Work Note (Signed)
Clinical Social Work Assessment  Patient Details  Name: Danielle Mathews MRN: [id number] Date of Birth: [birth year]  Date of referral:  09/05/15               Reason for consult:  Discharge Planning                Permission sought to share information with:  Family Supports Permission granted to share information::  Yes, Verbal Permission Granted  Name::        Agency::     Relationship::   (Danielle Mathews )  Contact Information:     Housing/Transportation Living arrangements for the past 2 months:  Single Family Home Source of Information:  Patient, Adult Children Patient Interpreter Needed:  None Criminal Activity/Legal Involvement Pertinent to Current Situation/Hospitalization:  No - Comment as needed Significant Relationships:  Adult Children, Other Family Members Lives with:  Adult Children Do you feel safe going back to the place where you live?  Yes Need for family participation in patient care:  Yes (Comment) Danielle Mathews [place] )  Care giving concerns:  Patient's family reports that they do not feel comfortable with taking patient back home.    Social Worker assessment / plan:  CSW was alerted by [facility] Mathews that patient's family don't feel safe with brining patient back home. Met with patient. She reported she lives with her son in [place]. Requested to return home at discharge. Verbal Permission granted to speak to her son- Danielle Mathews. CSW called Danielle. Per Danielle Mathews he would like patient to go to SNF. Stated that he would like assistance with caring for her. Stated they are open to [facility] services. CSW informed RNCM of above. There are no other CSW needs at this time. CSW is available if a need were to arise.   Employment status:  Retired Nurse, adult PT Recommendations:  Not assessed at this time Information / Referral to community resources:     Patient/Family's Response to care:  Patient's family reports they need more assistance with patient at home. Stated the  are open to [facility] services coming to assist them at discharge   Patient/Family's Understanding of and Emotional Response to Diagnosis, Current Treatment, and Prognosis:  Reports they understand why patient was admitted int Danielle Mathews and looks forward to Danielle Mathews services being ararnged by Danielle Mathews.   Emotional Assessment Appearance:  Appears stated age Attitude/Demeanor/Rapport:   (None) Affect (typically observed):  Calm, Pleasant Orientation:  Oriented to Self Alcohol / Substance use:  Not Applicable Psych involvement (Current and /or in the community):  No (Comment)  Discharge Needs  Concerns to be addressed:  Discharge Planning Concerns Readmission within the last 30 days:  No Current discharge risk:  None Barriers to Discharge:  No Barriers Identified   [place], LCSW 09/05/2015, 3:12 PM

## 2015-09-05 NOTE — Evaluation (Signed)
Physical Therapy Evaluation Patient Details Name: Danielle MaskCarrie B Buitron MRN: [id number] DOB: 04-28-1922 Today's Date: 09/05/2015   History of Present Illness  82 y/o female who [place]. She had a fall and suffered multiple rib fractures.    Clinical Impression  Pt is HOH, but with effort is able to understand all that PT requests and she is able to do some limited ambulation and mobility acts.  She reports moderate pain that increases some with activity but is never overly limiting.  She fatigues with the effort of walking (and appears to have some discomfort with increased breathing) but her sats remain in the 90s on 2 liters O2 during the effort.  Pt shows ability to do be mobility with only CGA and should be able to return home with family assistance, would benefit from HHPT per pt/family interest.     Follow Up Recommendations Home health PT    Equipment Recommendations       Recommendations for Other Services       Precautions / Restrictions Precautions Precautions: Fall Restrictions Weight Bearing Restrictions: No      Mobility  Bed Mobility Overal bed mobility: Modified Independent             General bed mobility comments: Heavy UE use on rails, pt struggles and has some pain with the transition but was able to get to sitting w/o direct assist, pt also able to get legs up into bed w/ great effort, but no assist  Transfers Overall transfer level: Modified independent Equipment used: Rolling walker (2 wheeled)             General transfer comment: Pt is very short, so getting feet to the floor gets her nearly to standing.  Pt did need walker to maintain balnce.   Ambulation/Gait Ambulation/Gait assistance: Min guard Ambulation Distance (Feet): 25 Feet Assistive device: Rolling walker (2 wheeled)       General Gait Details: Pt with very stooped, slow ambulation.  She is reliant on the walker but does not have any LOBs or serious safety  concerns with the effort.  Pt has some fatigue, but her sats remain in the 90s on her baseline 2 liters  Stairs            Wheelchair Mobility    Modified Rankin (Stroke Patients Only)       Balance                                             Pertinent Vitals/Pain Pain Assessment: 0-10 Pain Score: 5  Pain Location: chest and rib cage    Home Living Family/patient expects to be discharged to:: Private residence Living Arrangements: Children                    Prior Function Level of Independence: Independent with assistive device(s)         Comments: uses rollator, apparently she is relatively active      Hand Dominance        Extremity/Trunk Assessment   Upper Extremity Assessment:  (R shoulder very limited, L elevation to ~90, grossly 3+/5)           Lower Extremity Assessment: Overall WFL for tasks assessed;Generalized weakness         Communication   Communication: HOH  Cognition Arousal/Alertness: Awake/alert Behavior During  Therapy: Restless Overall Cognitive Status: Within Functional Limits for tasks assessed                      General Comments      Exercises        Assessment/Plan    PT Assessment Patient needs continued PT services  PT Diagnosis Difficulty walking;Acute pain;Generalized weakness   PT Problem List Decreased strength;Decreased balance;Decreased activity tolerance;Decreased range of motion;Decreased mobility;Decreased coordination;Decreased knowledge of use of DME;Decreased safety awareness  PT Treatment Interventions DME instruction;Gait training;Stair training;Functional mobility training;Therapeutic activities;Therapeutic exercise;Neuromuscular re-education;Balance training   PT Goals (Current goals can be found in the Care Plan section) Acute Rehab PT Goals Patient Stated Goal: go home PT Goal Formulation: With patient Time For Goal Achievement: 09/12/15 Potential to  Achieve Goals: Fair    Frequency Min 2X/week   Barriers to discharge        Co-evaluation               End of Session Equipment Utilized During Treatment: Gait belt;Oxygen (2 liters) Activity Tolerance: Patient limited by fatigue;Patient limited by pain Patient left: with bed alarm set;with call bell/phone within reach      Functional Assessment Tool Used: clinical judgement Functional Limitation: Mobility: Walking and moving around Mobility: Walking and Moving Around Current Status (U9811): At least 20 percent but less than 40 percent impaired, limited or restricted Mobility: Walking and Moving Around Goal Status [phone]): At least 1 percent but less than 20 percent impaired, limited or restricted    Time: 1253-1313 PT Time Calculation (min) (ACUTE ONLY): 20 min   Charges:   PT Evaluation $PT Eval Low Complexity: 1 Procedure     PT G Codes:   PT G-Codes **NOT FOR INPATIENT CLASS** Functional Assessment Tool Used: clinical judgement Functional Limitation: Mobility: Walking and moving around Mobility: Walking and Moving Around Current Status (G9562): At least 20 percent but less than 40 percent impaired, limited or restricted Mobility: Walking and Moving Around Goal Status [phone]): At least 1 percent but less than 20 percent impaired, limited or restricted    Malachi Pro, DPT  09/05/2015, 2:40 PM

## 2015-09-05 NOTE — Care Management Note (Signed)
Case Management Note  Patient Details  Name: Danielle Mathews MRN: [id number] Date of Birth: January 08, 1923  Subjective/Objective:    Received a call back [facility] in [place]. They do not service Danielle Mathews's home address. Will discuss other home health providers with daughter Danielle Mathews who is coming to pick up Danielle Mathews today.                 Action/Plan:   Expected Discharge Date:                  Expected Discharge Plan:     In-House Referral:     Discharge planning Services     Post Acute Care Choice:    Choice offered to:     DME Arranged:    DME Agency:     HH Arranged:    HH Agency:     Status of Service:     Medicare Important Message Given:    Date Medicare IM Given:    Medicare IM give by:    Date Additional Medicare IM Given:    Additional Medicare Important Message give by:     If discussed at Long Length of Stay Meetings, dates discussed:    Additional Comments:  Ernst Cumpston A, RN 09/05/2015, 12:53 PM

## 2015-09-05 NOTE — Care Management Note (Signed)
Case Management Note  Patient Details  Name: Danielle Mathews MRN: [id number] Date of Birth: 1922-08-27  Subjective/Objective:    Discussed discharge planning with daughter [name]. Discussed providers in the [place] the [place] area. A referral for home health PT was fax[facility] to [facility] at fax: [phone] after a call to [facility] to make sure that [name]'s home address in [place] is within the service area of [facility].                 Action/Plan:   Expected Discharge Date:                  Expected Discharge Plan:     In-House Referral:     Discharge planning Services     Post Acute Care Choice:    Choice offered to:     DME Arranged:    DME Agency:     HH Arranged:    HH Agency:     Status of Service:     Medicare Important Message Given:    Date Medicare IM Given:    Medicare IM give by:    Date Additional Medicare IM Given:    Additional Medicare Important Message give by:     If discussed at Long Length of Stay Meetings, dates discussed:    Additional Comments:  [name] A, RN 09/05/2015, 2:54 PM

## 2015-09-05 NOTE — Discharge Summary (Signed)
[facility]   PATIENT NAME: Danielle BorderCarrie Mathews    MR#:  [id number]  DATE OF BIRTH:  1922-09-30  DATE OF ADMISSION:  09/04/2015 ADMITTING PHYSICIAN: Gery Prayebby Crosley, MD  DATE OF DISCHARGE:  09/05/15 PRIMARY CARE PHYSICIAN: No primary care provider on file.    ADMISSION DIAGNOSIS:  Rib fracture, left, closed, initial encounter [S22.32XA]  DISCHARGE DIAGNOSIS:   Closed left rib fractures SECONDARY DIAGNOSIS:   Past Medical History  Diagnosis Date  . CHF (congestive heart failure) (HCC)   . Coronary artery disease   . Hypertension     HOSPITAL COURSE:   Closed rib fracture.-lt -Continue Tylenol and Ultram. Continue Lidoderm patch  Pain is manageable PT is recommending home health PT-  Heart artery disease -Stable, home medications resumed.  Hypertension -Stable, home medications resumed  Multinodular goiter  thyroid ultrasound OP/OP F/U   DISCHARGE CONDITIONS:   fair  CONSULTS OBTAINED:      PROCEDURES none  DRUG ALLERGIES:   Allergies  Allergen Reactions  . Percocet [Oxycodone-Acetaminophen] Other (See Comments)    hallucinations    DISCHARGE MEDICATIONS:   Current Discharge Medication List    START taking these medications   Details  acetaminophen (TYLENOL) 325 MG tablet Take 2 tablets (650 mg total) by mouth every 6 (six) hours as needed for mild pain (or Fever >/= 101).    bisacodyl (DULCOLAX) 5 MG EC tablet Take 1 tablet (5 mg total) by mouth daily as needed for moderate constipation. Qty: 30 tablet, Refills: 0    lidocaine (LIDODERM) 5 % Place 1 patch onto the skin daily. Remove & Discard patch within 12 hours or as directed by MD Qty: 30 patch, Refills: 0      CONTINUE these medications which have CHANGED   Details  traMADol (ULTRAM) 50 MG tablet Take 0.5 tablets (25 mg total) by mouth every 6 (six) hours as needed. Qty: 30 tablet, Refills: 0      CONTINUE these medications which have NOT  CHANGED   Details  aspirin EC 81 MG tablet Take 81 mg by mouth at bedtime.    benzonatate (TESSALON) 100 MG capsule Take 100 mg by mouth 3 (three) times daily as needed for cough.    celecoxib (CELEBREX) 200 MG capsule Take 200 mg by mouth at bedtime as needed for mild pain or moderate pain.    citalopram (CELEXA) 20 MG tablet Take 20 mg by mouth at bedtime.    furosemide (LASIX) 40 MG tablet Take 40 mg by mouth every morning.    Homeopathic Products ([employer] LEG CRAMPS PAIN RELIEF) TABS Take 1 tablet by mouth 2 (two) times daily.    loratadine (CLARITIN) 10 MG tablet Take 10 mg by mouth every morning.    metoprolol succinate (TOPROL-XL) 25 MG 24 hr tablet Take 25 mg by mouth daily.    Multiple Vitamins-Minerals (COMPLETE MULTIVITAMIN/MINERAL PO) Take 1 tablet by mouth every morning.    Multiple Vitamins-Minerals (HAIR SKIN AND NAILS FORMULA) TABS Take 1 tablet by mouth 2 (two) times daily.    Multiple Vitamins-Minerals (OCUVITE ADULT 50+) CAPS Take 1 capsule by mouth every morning.    omeprazole (PRILOSEC) 40 MG capsule Take 40 mg by mouth every morning.    simvastatin (ZOCOR) 20 MG tablet Take 20 mg by mouth every evening.    Ubiquinone (ULTRA COQ10 PO) Take 100 mg by mouth every morning.    zolpidem (AMBIEN) 5 MG tablet Take 0.5 mg by mouth at bedtime.  DISCHARGE INSTRUCTIONS:   Activity as tolerated PER PT recommendations Cardiac diet Follow-up with primary care physician in a week  DIET:  Cardiac diet  DISCHARGE CONDITION:  Fair  ACTIVITY:  Activity as tolerated  OXYGEN:  Home Oxygen: Yes.     Oxygen Delivery: 2 liters/min via Patient connected to nasal cannula oxygen  DISCHARGE LOCATION:  home with home health  If you experience worsening of your admission symptoms, develop shortness of breath, life threatening emergency, suicidal or homicidal thoughts you must seek medical attention immediately by calling 911 or calling your MD immediately  if  symptoms less severe.  You Must read complete instructions/literature along with all the possible adverse reactions/side effects for all the Medicines you take and that have been prescribed to you. Take any new Medicines after you have completely understood and accpet all the possible adverse reactions/side effects.   Please note  You were cared for by a hospitalist during your hospital stay. If you have any questions about your discharge medications or the care you received while you were in the hospital after you are discharged, you can call the unit and asked to speak with the hospitalist on call if the hospitalist that took care of you is not available. Once you are discharged, your primary care physician will handle any further medical issues. Please note that NO REFILLS for any discharge medications will be authorized once you are discharged, as it is imperative that you return to your primary care physician (or establish a relationship with a primary care physician if you do not have one) for your aftercare needs so that they can reassess your need for medications and monitor your lab values.     Today  Chief Complaint  Patient presents with  . Fall    Pt. fell last night around 2 am.  Pt. c/o of pain to lt. flank.   Patient is resting comfortably. Pain is manageable with the current pain regimen. Wants to go home. PT is recommending home health PT  ROS:  CONSTITUTIONAL: Denies fevers, chills. Denies any fatigue, weakness.  EYES: Denies blurry vision, double vision, eye pain. EARS, NOSE, THROAT: Denies tinnitus, ear pain, hearing loss. RESPIRATORY: Denies cough, wheeze, shortness of breath.  CARDIOVASCULAR: Denies chest pain, palpitations, edema. Reporting RIB pain with movements GASTROINTESTINAL: Denies nausea, vomiting, diarrhea, abdominal pain. Denies bright red blood per rectum. GENITOURINARY: Denies dysuria, hematuria. ENDOCRINE: Denies nocturia or thyroid  problems. HEMATOLOGIC AND LYMPHATIC: Denies easy bruising or bleeding. SKIN: Denies rash or lesion. MUSCULOSKELETAL: Denies pain in neck, back, shoulder, knees, hips or arthritic symptoms.  NEUROLOGIC: Denies paralysis, paresthesias.  PSYCHIATRIC: Denies anxiety or depressive symptoms.   VITAL SIGNS:  Blood pressure 135/84, pulse 60, temperature 98 F (36.7 C), temperature source Oral, resp. rate 16, height 4\' 10"  (1.473 m), weight 55.792 kg (123 lb), SpO2 100 %.  I/O:    Intake/Output Summary (Last 24 hours) at 09/05/15 1413 Last data filed at 09/05/15 1223  Gross per 24 hour  Intake      0 ml  Output    650 ml  Net   -650 ml    PHYSICAL EXAMINATION:  GENERAL:  82 y.o.-year-old patient lying in the bed with no acute distress.  EYES: Pupils equal, round, reactive to light and accommodation. No scleral icterus. Extraocular muscles intact.  HEENT: Head atraumatic, normocephalic. Oropharynx and nasopharynx clear.  NECK:  Supple, no jugular venous distention. No thyroid enlargement, no tenderness.  LUNGS: Normal breath sounds bilaterally, no wheezing,  rales,rhonchi or crepitation. No use of accessory muscles of respiration.  CARDIOVASCULAR: S1, S2 normal. No murmurs, rubs, or gallops. Left anterior chest wall is tender with palpation ABDOMEN: Soft, non-tender, non-distended. Bowel sounds present. No organomegaly or mass.  EXTREMITIES: No pedal edema, cyanosis, or clubbing.  NEUROLOGIC: Cranial nerves II through XII are intact. Muscle strength 5/5 in all extremities. Sensation intact. Gait not checked.  PSYCHIATRIC: The patient is alert and oriented x 3.  SKIN: No obvious rash, lesion, or ulcer.   DATA REVIEW:   CBC  Recent Labs Lab 09/05/15 0326  WBC 8.6  HGB 10.0*  HCT 28.6*  PLT 164    Chemistries   Recent Labs Lab 09/05/15 0326  NA 141  K 3.7  CL 104  CO2 30  GLUCOSE 100*  BUN 28*  CREATININE 1.21*  CALCIUM 8.8*    Cardiac Enzymes No results for  input(s): TROPONINI in the last 168 hours.  Microbiology Results  No results found for this or any previous visit.  RADIOLOGY:  Ct Chest Wo Contrast  09/04/2015  CLINICAL DATA:  Left rib pain after falling at 2 a.m. today. EXAM: CT CHEST WITHOUT CONTRAST TECHNIQUE: Multidetector CT imaging of the chest was performed following the standard protocol without IV contrast. COMPARISON:  Chest radiographs dated 09/15/2008. FINDINGS: Mediastinum/Lymph Nodes: Dense atheromatous Coronary artery calcifications. No mediastinal blood or enlarged lymph nodes. Mildly enlarged thyroid gland containing multiple small nodules with coarse calcifications. Lungs/Pleura: A large right diaphragmatic eventration is again demonstrated. Bilateral linear atelectasis a or scarring. No pneumothorax. Small left pleural effusion. Mild left lower lobe atelectasis. Upper abdomen: No acute findings. Musculoskeletal: Mildly displaced left eighth, ninth, tenth and eleventh posterior rib fractures. Additional old, healed bilateral rib fractures. Interval approximately 9% T9 vertebral body compression fracture. This has an appearance suggesting an acute fracture in the axial plane. However, in the coronal and sagittal planes, this appears more chronic. There is associated mild to moderate bony retropulsion, fused with the remainder the vertebral body. This causing mild to moderate canal stenosis. There are additional T4, T5 and T11 vertebral compression deformities with no visible acute fracture lines. Mild bony retropulsion at the T4-T5 levels. IMPRESSION: 1. Acute left eighth, ninth, tenth and eleventh posterior rib fractures with mild displacement. 2. Small left pleural effusion. 3. Mild left lower lobe atelectasis. 4. Additional bilateral linear atelectasis and scarring. 5. Stable large right diaphragmatic eventration. 6. Multiple old thoracic vertebral compression deformities with progression. No definite acute vertebral fractures are seen.  7. Dense atheromatous coronary artery calcifications. 8. Multinodular thyroid. Electronically Signed   By: Beckie Salts M.D.   On: 09/04/2015 18:52   US Thyroid  09/05/2015  CLINICAL DATA:  82 year old female with abnormal thyroid gland on recent chest CT EXAM: THYROID ULTRASOUND TECHNIQUE: Ultrasound examination of the thyroid gland and adjacent soft tissues was performed. COMPARISON:  CT scan of the chest 09/04/2015 FINDINGS: Right thyroid lobe Measurements: 5.1 x 2.0 x 4.0 cm. Diffusely heterogeneous and multinodular thyroid gland. I hypoechoic solid nodule in the upper gland measures 1.4 x 1.3 x 0.7 cm. A heterogeneous isoechoic solid nodule in the mid gland measures 1.7 x 1.5 x 1.4 cm. An Iso to slightly hyperechoic solid nodule in the lower gland measures 1.5 x 1.9 x 2.2 cm. Small ring calcified solid nodule in the medial aspect of the lower gland measures only 4 mm. Left thyroid lobe Measurements: 5.1 x 1.9 x 2.8 cm. Heterogeneous and multi nodular thyroid gland. A slightly hypoechoic solid  nodule with internal dystrophic calcification in the mid gland measures 1.7 x 1.6 x 1.3 cm. Hypoechoic somewhat ill-defined nodule in the lower gland measures 1.8 x 1.4 x 1.1 cm. Isthmus Thickness: 0.3 cm.  No nodules visualized. Lymphadenopathy None visualized. IMPRESSION: Diffusely heterogeneous multinodular thyroid gland the appearance of which is most suggestive of multi nodular goiter. There are multiple nodules bilaterally which would technically meet consensus criteria for biopsy. However, given the patient's demographics a more conservative approach with observation (repeat ultrasound at 1 year) could be considered. If a biopsy is clinically desired/warranted, the 2 left-sided lesions would be the targets of choice. Electronically Signed   By: Malachy Moan M.D.   On: 09/05/2015 08:45    EKG:   Orders placed or performed during the hospital encounter of 09/04/15  . ED EKG  . ED EKG  . EKG 12-Lead  .  EKG 12-Lead      Management plans discussed with the patient, Daughter-in-law  and they are in agreement.  CODE STATUS:     Code Status Orders        Start     Ordered   09/04/15 2220  Full code   Continuous     09/04/15 2219    Code Status History    Date Active Date Inactive Code Status Order ID Comments User Context   This patient has a current code status but no historical code status.    Advance Directive Documentation        Most Recent Value   Type of Advance Directive  Healthcare Power of Attorney, Living will   Pre-existing out of facility DNR order (yellow form or pink MOST form)     "MOST" Form in Place?        TOTAL TIME TAKING CARE OF THIS PATIENT: 45  minutes.   Note: This dictation was prepared with Dragon dictation along with smaller phrase technology. Any transcriptional errors that result from this process are unintentional.   @MEC @  on 09/05/2015 at 2:13 PM  Between 7am to 6pm - Pager - [phone]  After 6pm go to [email or web address] - password [email or web address] [facility]  [place] [facility]  Office  [phone]  CC: Primary care physician; No primary care provider on file.

## 2015-09-05 NOTE — Care Management Note (Signed)
Case Management Note  Patient Details  Name: Kaleen MaskCarrie B Dials MRN: [id number] Date of Birth: 1922-10-25  Subjective/Objective:       [facility] health [facility] for Mrs Laural BenesJohnson who resides in [place] with her son.              Action/Plan:   Expected Discharge Date:                  Expected Discharge Plan:     In-House Referral:     Discharge planning Services     Post Acute Care Choice:    Choice offered to:     DME Arranged:    DME Agency:     HH Arranged:    HH Agency:     Status of Service:     Medicare Important Message Given:    Date Medicare IM Given:    Medicare IM give by:    Date Additional Medicare IM Given:    Additional Medicare Important Message give by:     If discussed at Long Length of Stay Meetings, dates discussed:    Additional Comments:  Yicel Shannon A, RN 09/05/2015, 11:39 AM

## 2015-09-06 LAB — THYROID PANEL WITH TSH
FREE THYROXINE INDEX: 1.9 (ref 1.2–4.9)
T3 UPTAKE RATIO: 30 % (ref 24–39)
T4, Total: 6.4 ug/dL (ref 4.5–12.0)
TSH: 1.02 u[IU]/mL (ref 0.450–4.500)

## 2016-11-10 IMAGING — US US THYROID
1 series · 13 of 25 positions shown · non-contrast
Comparison: CT scan of the chest 09/04/2015

CLINICAL DATA: [AGE] female with abnormal thyroid gland on
recent chest CT

EXAM:
THYROID ULTRASOUND
TECHNIQUE: Ultrasound examination of the thyroid gland and adjacent soft
tissues was performed.

[Series 1: us thyroid · 0.07mm/px · 13 of 61 slices shown]
[im 1/61]
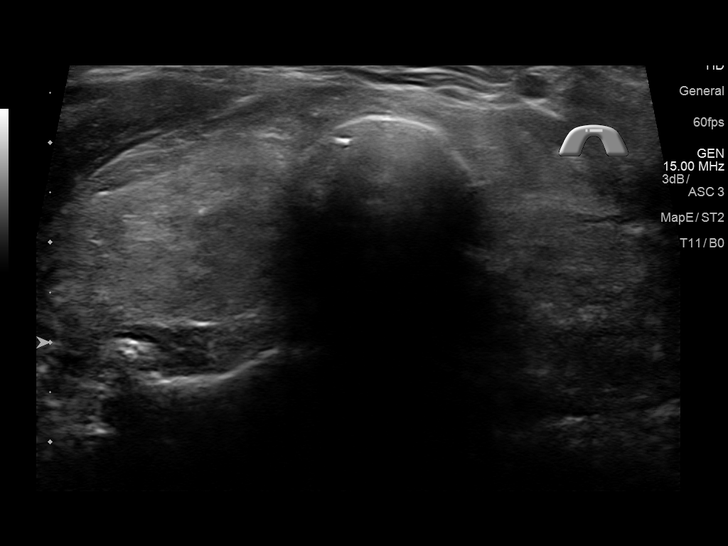
[im 6/61]
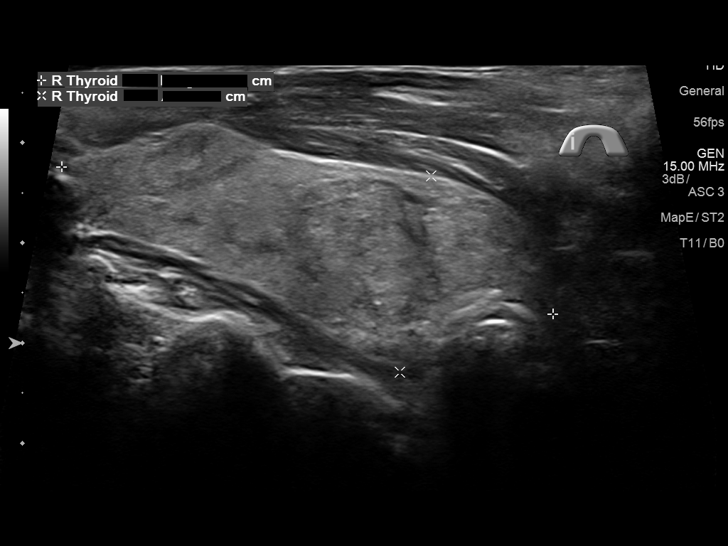
[im 11/61]
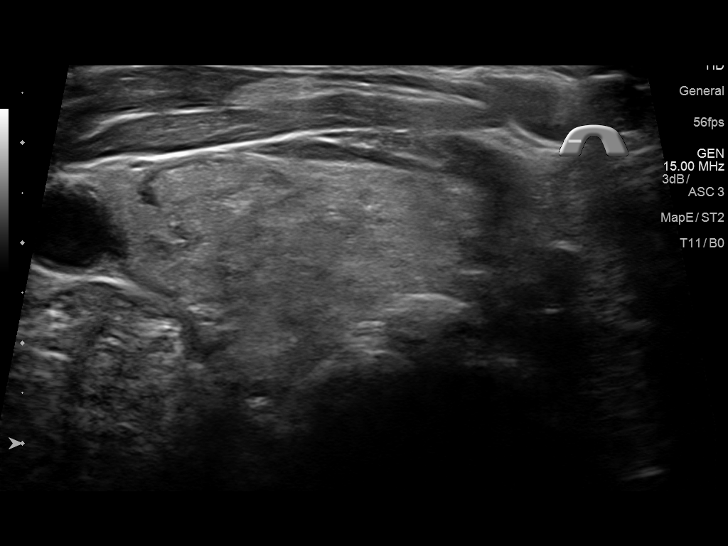
[im 16/61]
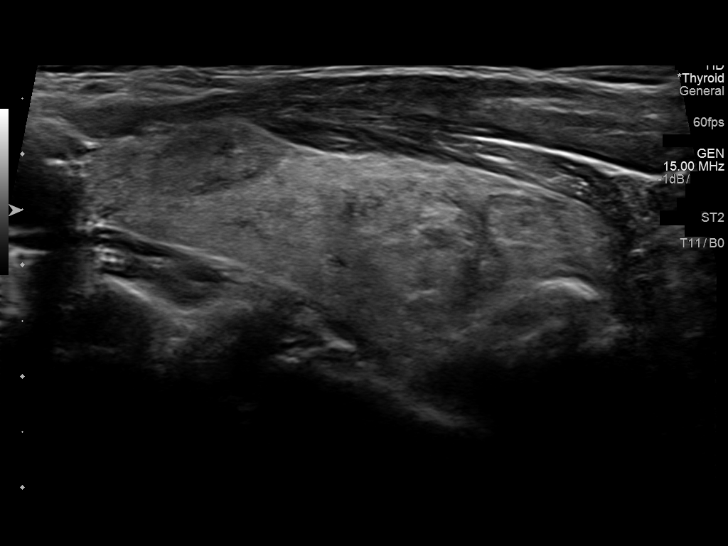
[im 21/61]
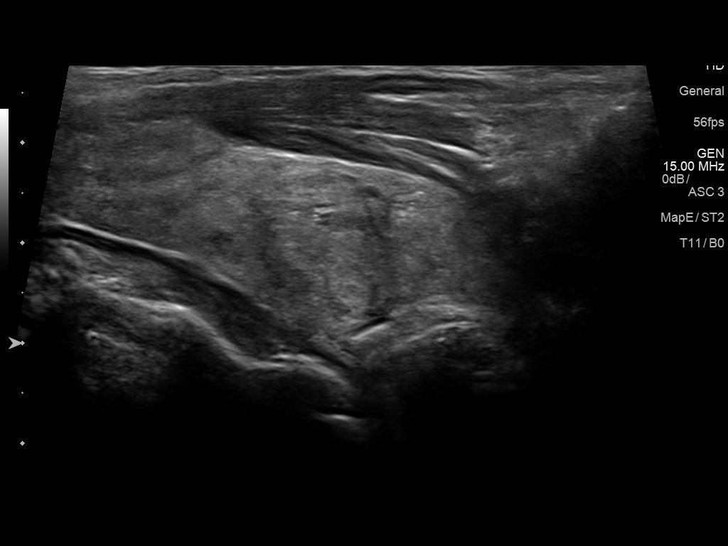
[im 26/61]
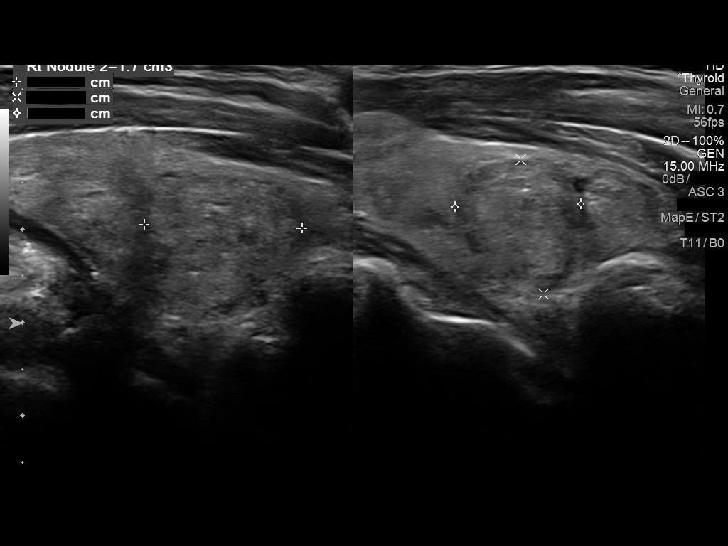
[im 31/61]
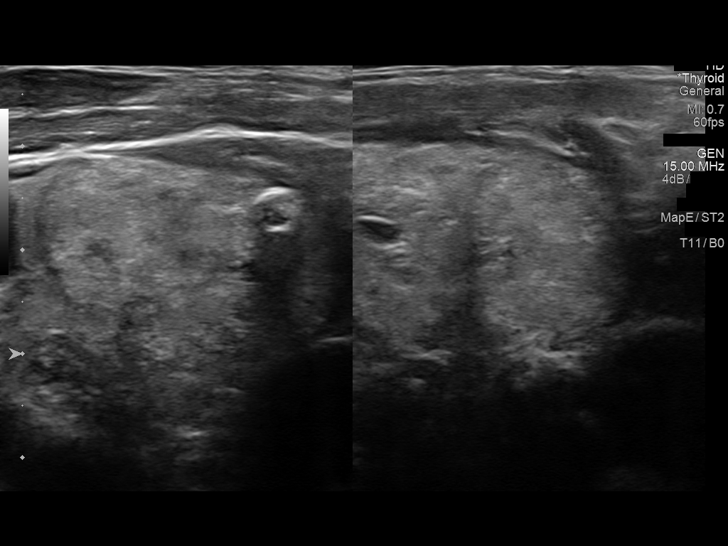
[im 36/61]
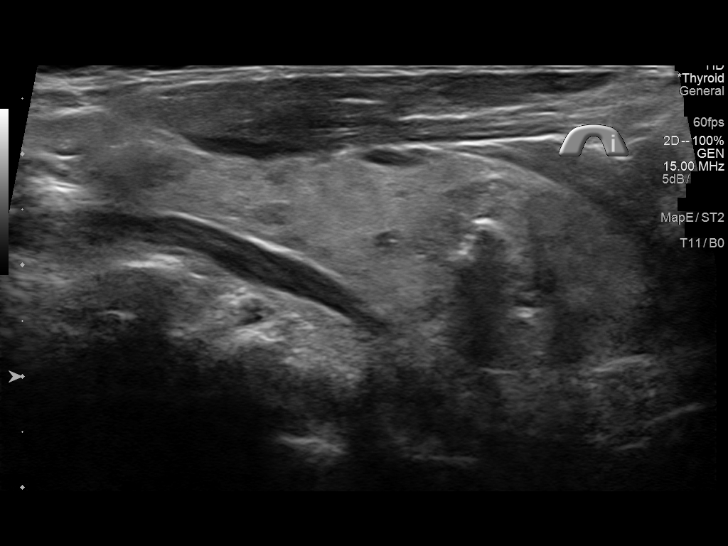
[im 41/61]
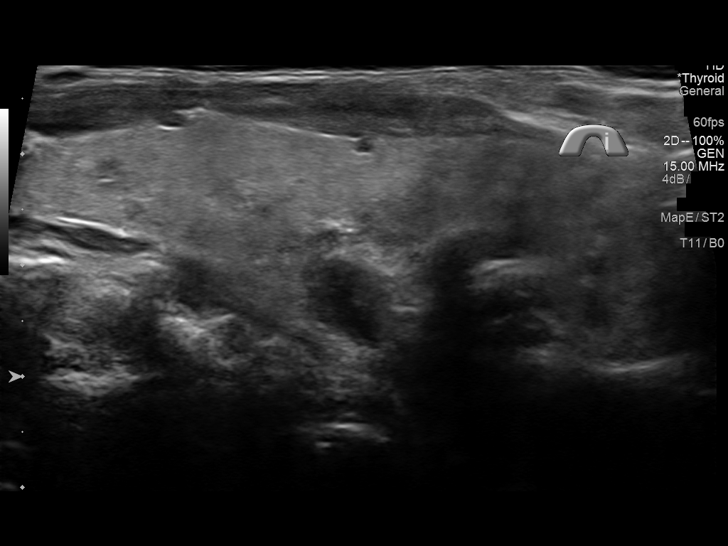
[im 46/61]
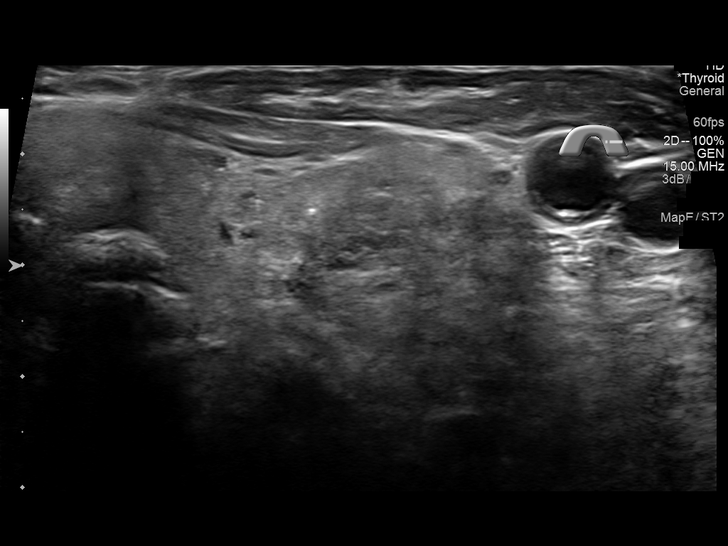
[im 51/61]
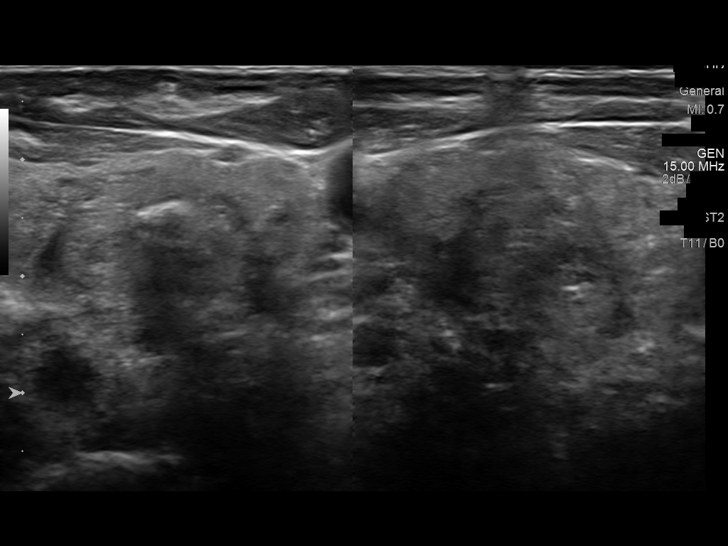
[im 56/61]
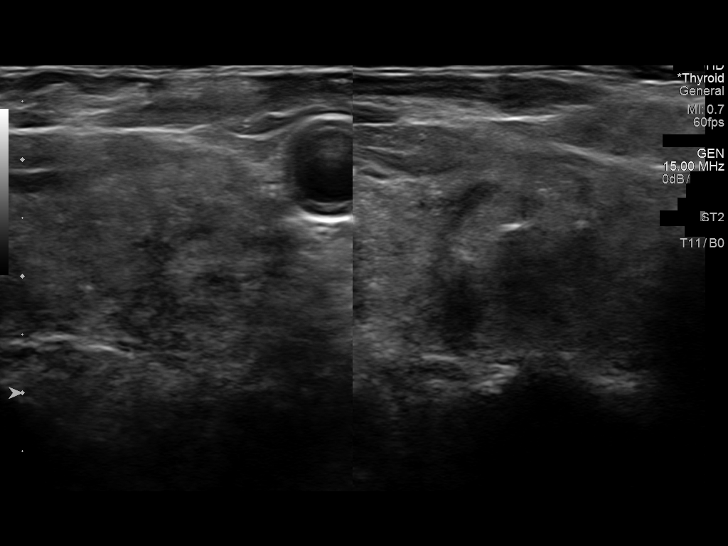
[im 61/61]
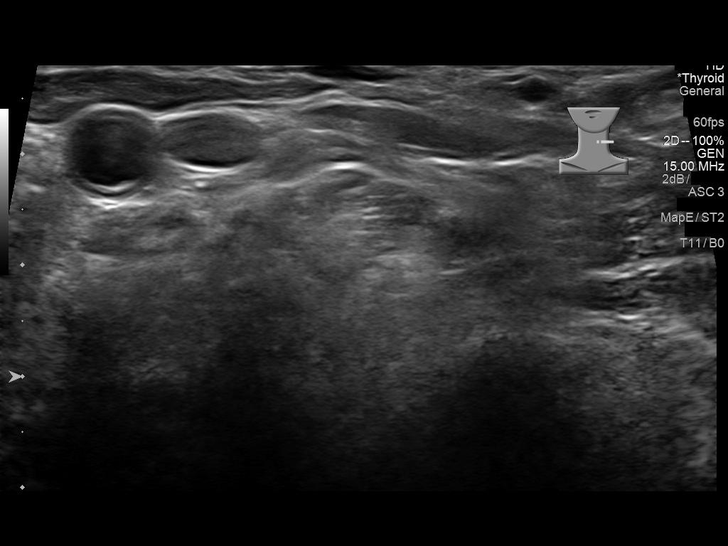

[13 of 25 positions shown; findings below may reference images not displayed]

FINDINGS: Right thyroid lobe

Measurements: 5.1 x 2.0 x 4.0 cm. Diffusely heterogeneous and
multinodular thyroid gland. I hypoechoic solid nodule in the upper
gland measures 1.4 x 1.3 x 0.7 cm. A heterogeneous isoechoic solid
nodule in the mid gland measures 1.7 x 1.5 x 1.4 cm. An Iso to
slightly hyperechoic solid nodule in the lower gland measures 1.5 x
1.9 x 2.2 cm. Small ring calcified solid nodule in the medial aspect
of the lower gland measures only 4 mm.

Left thyroid lobe

Measurements: 5.1 x 1.9 x 2.8 cm. Heterogeneous and multi nodular
thyroid gland. A slightly hypoechoic solid nodule with internal
dystrophic calcification in the mid gland measures 1.7 x 1.6 x
cm. Hypoechoic somewhat ill-defined nodule in the lower gland
measures 1.8 x 1.4 x 1.1 cm.

Isthmus

Thickness: 0.3 cm.  No nodules visualized.

Lymphadenopathy

None visualized.
IMPRESSION: Diffusely heterogeneous multinodular thyroid gland the appearance of
which is most suggestive of multi nodular goiter.

There are multiple nodules bilaterally which would technically meet
consensus criteria for biopsy. However, given the patient's
demographics a more conservative approach with observation (repeat
ultrasound at 1 year) could be considered.

If a biopsy is clinically desired/warranted, the 2 left-sided
lesions would be the targets of choice.

## 2017-04-27 DEATH — deceased
